# Patient Record
Sex: Male | Born: 1945 | Race: White | Hispanic: No | Smoking: Former smoker
Health system: Southern US, Community
[De-identification: ages and names within clinical notes are randomized; demographics above are authoritative.]

## PROBLEM LIST (undated history)

## (undated) DIAGNOSIS — I10 Essential (primary) hypertension: Secondary | ICD-10-CM

## (undated) HISTORY — PX: TONSILLECTOMY: SUR1361

---

## 2015-07-29 DIAGNOSIS — M5442 Lumbago with sciatica, left side: Secondary | ICD-10-CM | POA: Diagnosis not present

## 2015-07-29 DIAGNOSIS — M9902 Segmental and somatic dysfunction of thoracic region: Secondary | ICD-10-CM | POA: Diagnosis not present

## 2015-07-29 DIAGNOSIS — M9903 Segmental and somatic dysfunction of lumbar region: Secondary | ICD-10-CM | POA: Diagnosis not present

## 2015-07-29 DIAGNOSIS — M9905 Segmental and somatic dysfunction of pelvic region: Secondary | ICD-10-CM | POA: Diagnosis not present

## 2015-08-08 DIAGNOSIS — M9902 Segmental and somatic dysfunction of thoracic region: Secondary | ICD-10-CM | POA: Diagnosis not present

## 2015-08-08 DIAGNOSIS — M9903 Segmental and somatic dysfunction of lumbar region: Secondary | ICD-10-CM | POA: Diagnosis not present

## 2015-08-08 DIAGNOSIS — M9905 Segmental and somatic dysfunction of pelvic region: Secondary | ICD-10-CM | POA: Diagnosis not present

## 2015-08-08 DIAGNOSIS — M5442 Lumbago with sciatica, left side: Secondary | ICD-10-CM | POA: Diagnosis not present

## 2015-08-17 DIAGNOSIS — M5442 Lumbago with sciatica, left side: Secondary | ICD-10-CM | POA: Diagnosis not present

## 2015-08-17 DIAGNOSIS — M9902 Segmental and somatic dysfunction of thoracic region: Secondary | ICD-10-CM | POA: Diagnosis not present

## 2015-08-17 DIAGNOSIS — M9905 Segmental and somatic dysfunction of pelvic region: Secondary | ICD-10-CM | POA: Diagnosis not present

## 2015-08-17 DIAGNOSIS — M9903 Segmental and somatic dysfunction of lumbar region: Secondary | ICD-10-CM | POA: Diagnosis not present

## 2015-08-23 DIAGNOSIS — Z131 Encounter for screening for diabetes mellitus: Secondary | ICD-10-CM | POA: Diagnosis not present

## 2015-08-23 DIAGNOSIS — E785 Hyperlipidemia, unspecified: Secondary | ICD-10-CM | POA: Diagnosis not present

## 2015-08-23 DIAGNOSIS — Z0000 Encounter for general adult medical examination without abnormal findings: Secondary | ICD-10-CM | POA: Diagnosis not present

## 2015-08-23 DIAGNOSIS — Z720 Tobacco use: Secondary | ICD-10-CM | POA: Diagnosis not present

## 2015-08-23 DIAGNOSIS — Z0100 Encounter for examination of eyes and vision without abnormal findings: Secondary | ICD-10-CM | POA: Diagnosis not present

## 2015-08-23 DIAGNOSIS — E669 Obesity, unspecified: Secondary | ICD-10-CM | POA: Diagnosis not present

## 2015-08-23 DIAGNOSIS — Z136 Encounter for screening for cardiovascular disorders: Secondary | ICD-10-CM | POA: Diagnosis not present

## 2015-08-23 DIAGNOSIS — Z87891 Personal history of nicotine dependence: Secondary | ICD-10-CM | POA: Diagnosis not present

## 2015-08-23 DIAGNOSIS — I10 Essential (primary) hypertension: Secondary | ICD-10-CM | POA: Diagnosis not present

## 2015-08-23 DIAGNOSIS — Z01110 Encounter for hearing examination following failed hearing screening: Secondary | ICD-10-CM | POA: Diagnosis not present

## 2015-09-01 DIAGNOSIS — M5442 Lumbago with sciatica, left side: Secondary | ICD-10-CM | POA: Diagnosis not present

## 2015-09-01 DIAGNOSIS — M9905 Segmental and somatic dysfunction of pelvic region: Secondary | ICD-10-CM | POA: Diagnosis not present

## 2015-09-01 DIAGNOSIS — M9903 Segmental and somatic dysfunction of lumbar region: Secondary | ICD-10-CM | POA: Diagnosis not present

## 2015-09-01 DIAGNOSIS — M9902 Segmental and somatic dysfunction of thoracic region: Secondary | ICD-10-CM | POA: Diagnosis not present

## 2015-09-28 DIAGNOSIS — I10 Essential (primary) hypertension: Secondary | ICD-10-CM | POA: Diagnosis not present

## 2015-09-28 DIAGNOSIS — Z87891 Personal history of nicotine dependence: Secondary | ICD-10-CM | POA: Diagnosis not present

## 2015-09-28 DIAGNOSIS — E785 Hyperlipidemia, unspecified: Secondary | ICD-10-CM | POA: Diagnosis not present

## 2015-09-28 DIAGNOSIS — R972 Elevated prostate specific antigen [PSA]: Secondary | ICD-10-CM | POA: Diagnosis not present

## 2015-09-28 DIAGNOSIS — E669 Obesity, unspecified: Secondary | ICD-10-CM | POA: Diagnosis not present

## 2015-10-05 ENCOUNTER — Ambulatory Visit: Payer: Medicare Other | Attending: Internal Medicine | Admitting: Audiology

## 2015-10-05 DIAGNOSIS — H93292 Other abnormal auditory perceptions, left ear: Secondary | ICD-10-CM | POA: Insufficient documentation

## 2015-10-05 DIAGNOSIS — H9042 Sensorineural hearing loss, unilateral, left ear, with unrestricted hearing on the contralateral side: Secondary | ICD-10-CM

## 2015-10-05 DIAGNOSIS — H9191 Unspecified hearing loss, right ear: Secondary | ICD-10-CM | POA: Diagnosis not present

## 2015-10-05 DIAGNOSIS — Z01118 Encounter for examination of ears and hearing with other abnormal findings: Secondary | ICD-10-CM | POA: Diagnosis not present

## 2015-10-05 DIAGNOSIS — IMO0001 Reserved for inherently not codable concepts without codable children: Secondary | ICD-10-CM

## 2015-10-05 DIAGNOSIS — R94128 Abnormal results of other function studies of ear and other special senses: Secondary | ICD-10-CM | POA: Insufficient documentation

## 2015-10-05 DIAGNOSIS — H905 Unspecified sensorineural hearing loss: Secondary | ICD-10-CM | POA: Diagnosis not present
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
## 2015-12-12 DIAGNOSIS — I10 Essential (primary) hypertension: Secondary | ICD-10-CM | POA: Diagnosis not present

## 2015-12-12 DIAGNOSIS — E785 Hyperlipidemia, unspecified: Secondary | ICD-10-CM | POA: Diagnosis not present

## 2015-12-15 DIAGNOSIS — I10 Essential (primary) hypertension: Secondary | ICD-10-CM | POA: Diagnosis not present

## 2015-12-15 DIAGNOSIS — E785 Hyperlipidemia, unspecified: Secondary | ICD-10-CM | POA: Diagnosis not present

## 2015-12-28 DIAGNOSIS — I10 Essential (primary) hypertension: Secondary | ICD-10-CM | POA: Diagnosis not present

## 2015-12-28 DIAGNOSIS — E785 Hyperlipidemia, unspecified: Secondary | ICD-10-CM | POA: Diagnosis not present

## 2015-12-28 DIAGNOSIS — R972 Elevated prostate specific antigen [PSA]: Secondary | ICD-10-CM | POA: Diagnosis not present

## 2015-12-28 DIAGNOSIS — Z87891 Personal history of nicotine dependence: Secondary | ICD-10-CM | POA: Diagnosis not present

## 2016-01-06 DIAGNOSIS — M9902 Segmental and somatic dysfunction of thoracic region: Secondary | ICD-10-CM | POA: Diagnosis not present

## 2016-01-06 DIAGNOSIS — M5442 Lumbago with sciatica, left side: Secondary | ICD-10-CM | POA: Diagnosis not present

## 2016-01-06 DIAGNOSIS — M9905 Segmental and somatic dysfunction of pelvic region: Secondary | ICD-10-CM | POA: Diagnosis not present

## 2016-01-06 DIAGNOSIS — M9903 Segmental and somatic dysfunction of lumbar region: Secondary | ICD-10-CM | POA: Diagnosis not present

## 2016-01-26 DIAGNOSIS — E785 Hyperlipidemia, unspecified: Secondary | ICD-10-CM | POA: Diagnosis not present

## 2016-01-26 DIAGNOSIS — I10 Essential (primary) hypertension: Secondary | ICD-10-CM | POA: Diagnosis not present

## 2016-02-10 DIAGNOSIS — M9903 Segmental and somatic dysfunction of lumbar region: Secondary | ICD-10-CM | POA: Diagnosis not present

## 2016-02-10 DIAGNOSIS — M9905 Segmental and somatic dysfunction of pelvic region: Secondary | ICD-10-CM | POA: Diagnosis not present

## 2016-02-10 DIAGNOSIS — M5442 Lumbago with sciatica, left side: Secondary | ICD-10-CM | POA: Diagnosis not present

## 2016-02-10 DIAGNOSIS — M9902 Segmental and somatic dysfunction of thoracic region: Secondary | ICD-10-CM | POA: Diagnosis not present

## 2016-02-13 DIAGNOSIS — M9905 Segmental and somatic dysfunction of pelvic region: Secondary | ICD-10-CM | POA: Diagnosis not present

## 2016-02-13 DIAGNOSIS — M5442 Lumbago with sciatica, left side: Secondary | ICD-10-CM | POA: Diagnosis not present

## 2016-02-13 DIAGNOSIS — M9902 Segmental and somatic dysfunction of thoracic region: Secondary | ICD-10-CM | POA: Diagnosis not present

## 2016-02-13 DIAGNOSIS — M9903 Segmental and somatic dysfunction of lumbar region: Secondary | ICD-10-CM | POA: Diagnosis not present

## 2016-02-15 DIAGNOSIS — E785 Hyperlipidemia, unspecified: Secondary | ICD-10-CM | POA: Diagnosis not present

## 2016-02-15 DIAGNOSIS — M545 Low back pain: Secondary | ICD-10-CM | POA: Diagnosis not present

## 2016-02-15 DIAGNOSIS — R972 Elevated prostate specific antigen [PSA]: Secondary | ICD-10-CM | POA: Diagnosis not present

## 2016-02-15 DIAGNOSIS — I10 Essential (primary) hypertension: Secondary | ICD-10-CM | POA: Diagnosis not present

## 2016-02-16 DIAGNOSIS — M9903 Segmental and somatic dysfunction of lumbar region: Secondary | ICD-10-CM | POA: Diagnosis not present

## 2016-02-16 DIAGNOSIS — M5442 Lumbago with sciatica, left side: Secondary | ICD-10-CM | POA: Diagnosis not present

## 2016-02-16 DIAGNOSIS — M9902 Segmental and somatic dysfunction of thoracic region: Secondary | ICD-10-CM | POA: Diagnosis not present

## 2016-02-16 DIAGNOSIS — M9905 Segmental and somatic dysfunction of pelvic region: Secondary | ICD-10-CM | POA: Diagnosis not present

## 2016-02-16 DIAGNOSIS — I10 Essential (primary) hypertension: Secondary | ICD-10-CM | POA: Diagnosis not present

## 2016-02-16 DIAGNOSIS — E785 Hyperlipidemia, unspecified: Secondary | ICD-10-CM | POA: Diagnosis not present

## 2016-02-17 DIAGNOSIS — M5127 Other intervertebral disc displacement, lumbosacral region: Secondary | ICD-10-CM | POA: Diagnosis not present

## 2016-02-17 DIAGNOSIS — M5417 Radiculopathy, lumbosacral region: Secondary | ICD-10-CM | POA: Diagnosis not present

## 2016-02-22 DIAGNOSIS — M5442 Lumbago with sciatica, left side: Secondary | ICD-10-CM | POA: Diagnosis not present

## 2016-02-22 DIAGNOSIS — M9902 Segmental and somatic dysfunction of thoracic region: Secondary | ICD-10-CM | POA: Diagnosis not present

## 2016-02-22 DIAGNOSIS — M9905 Segmental and somatic dysfunction of pelvic region: Secondary | ICD-10-CM | POA: Diagnosis not present

## 2016-02-22 DIAGNOSIS — M9903 Segmental and somatic dysfunction of lumbar region: Secondary | ICD-10-CM | POA: Diagnosis not present

## 2016-02-23 ENCOUNTER — Other Ambulatory Visit: Payer: Self-pay | Admitting: Rehabilitation

## 2016-02-23 DIAGNOSIS — M5416 Radiculopathy, lumbar region: Secondary | ICD-10-CM

## 2016-02-27 ENCOUNTER — Ambulatory Visit
Admission: RE | Admit: 2016-02-27 | Discharge: 2016-02-27 | Disposition: A | Discharge status: Home / Self Care | Payer: Medicare Other | Source: Ambulatory Visit | Attending: Rehabilitation | Admitting: Rehabilitation

## 2016-02-27 DIAGNOSIS — M4806 Spinal stenosis, lumbar region: Secondary | ICD-10-CM | POA: Diagnosis not present

## 2016-02-27 DIAGNOSIS — M5416 Radiculopathy, lumbar region: Secondary | ICD-10-CM

## 2016-02-28 DIAGNOSIS — I10 Essential (primary) hypertension: Secondary | ICD-10-CM | POA: Diagnosis not present

## 2016-02-28 DIAGNOSIS — M5127 Other intervertebral disc displacement, lumbosacral region: Secondary | ICD-10-CM | POA: Diagnosis not present

## 2016-02-28 DIAGNOSIS — M5417 Radiculopathy, lumbosacral region: Secondary | ICD-10-CM | POA: Diagnosis not present

## 2016-02-29 DIAGNOSIS — Z87891 Personal history of nicotine dependence: Secondary | ICD-10-CM | POA: Diagnosis not present

## 2016-02-29 DIAGNOSIS — I10 Essential (primary) hypertension: Secondary | ICD-10-CM | POA: Diagnosis not present

## 2016-02-29 DIAGNOSIS — M9902 Segmental and somatic dysfunction of thoracic region: Secondary | ICD-10-CM | POA: Diagnosis not present

## 2016-02-29 DIAGNOSIS — R7303 Prediabetes: Secondary | ICD-10-CM | POA: Diagnosis not present

## 2016-02-29 DIAGNOSIS — M9903 Segmental and somatic dysfunction of lumbar region: Secondary | ICD-10-CM | POA: Diagnosis not present

## 2016-02-29 DIAGNOSIS — E785 Hyperlipidemia, unspecified: Secondary | ICD-10-CM | POA: Diagnosis not present

## 2016-02-29 DIAGNOSIS — M5442 Lumbago with sciatica, left side: Secondary | ICD-10-CM | POA: Diagnosis not present

## 2016-02-29 DIAGNOSIS — M9905 Segmental and somatic dysfunction of pelvic region: Secondary | ICD-10-CM | POA: Diagnosis not present

## 2016-03-16 DIAGNOSIS — E785 Hyperlipidemia, unspecified: Secondary | ICD-10-CM | POA: Diagnosis not present

## 2016-03-16 DIAGNOSIS — I10 Essential (primary) hypertension: Secondary | ICD-10-CM | POA: Diagnosis not present

## 2016-03-28 DIAGNOSIS — E785 Hyperlipidemia, unspecified: Secondary | ICD-10-CM | POA: Diagnosis not present

## 2016-03-28 DIAGNOSIS — I10 Essential (primary) hypertension: Secondary | ICD-10-CM | POA: Diagnosis not present

## 2016-03-28 DIAGNOSIS — R7303 Prediabetes: Secondary | ICD-10-CM | POA: Diagnosis not present

## 2016-03-28 DIAGNOSIS — Z87891 Personal history of nicotine dependence: Secondary | ICD-10-CM | POA: Diagnosis not present

## 2016-04-08 ENCOUNTER — Emergency Department (HOSPITAL_BASED_OUTPATIENT_CLINIC_OR_DEPARTMENT_OTHER): Payer: Medicare Other

## 2016-04-08 ENCOUNTER — Emergency Department (HOSPITAL_BASED_OUTPATIENT_CLINIC_OR_DEPARTMENT_OTHER)
Admission: EM | Admit: 2016-04-08 | Discharge: 2016-04-08 | Disposition: A | Discharge status: Home / Self Care | Payer: Medicare Other | Attending: Emergency Medicine | Admitting: Emergency Medicine

## 2016-04-08 ENCOUNTER — Encounter (HOSPITAL_BASED_OUTPATIENT_CLINIC_OR_DEPARTMENT_OTHER): Payer: Self-pay | Admitting: *Deleted

## 2016-04-08 DIAGNOSIS — Z87891 Personal history of nicotine dependence: Secondary | ICD-10-CM | POA: Insufficient documentation

## 2016-04-08 DIAGNOSIS — Y9355 Activity, bike riding: Secondary | ICD-10-CM | POA: Diagnosis not present

## 2016-04-08 DIAGNOSIS — Y999 Unspecified external cause status: Secondary | ICD-10-CM | POA: Insufficient documentation

## 2016-04-08 DIAGNOSIS — R109 Unspecified abdominal pain: Secondary | ICD-10-CM | POA: Insufficient documentation

## 2016-04-08 DIAGNOSIS — S299XXA Unspecified injury of thorax, initial encounter: Secondary | ICD-10-CM | POA: Diagnosis not present

## 2016-04-08 DIAGNOSIS — R0789 Other chest pain: Secondary | ICD-10-CM | POA: Diagnosis not present

## 2016-04-08 DIAGNOSIS — S2231XA Fracture of one rib, right side, initial encounter for closed fracture: Secondary | ICD-10-CM

## 2016-04-08 DIAGNOSIS — Z79899 Other long term (current) drug therapy: Secondary | ICD-10-CM | POA: Diagnosis not present

## 2016-04-08 DIAGNOSIS — S2241XA Multiple fractures of ribs, right side, initial encounter for closed fracture: Secondary | ICD-10-CM | POA: Diagnosis not present

## 2016-04-08 DIAGNOSIS — S3991XA Unspecified injury of abdomen, initial encounter: Secondary | ICD-10-CM | POA: Diagnosis not present

## 2016-04-08 DIAGNOSIS — Y92828 Other wilderness area as the place of occurrence of the external cause: Secondary | ICD-10-CM | POA: Diagnosis not present

## 2016-04-08 DIAGNOSIS — I10 Essential (primary) hypertension: Secondary | ICD-10-CM | POA: Insufficient documentation

## 2016-04-08 DIAGNOSIS — R079 Chest pain, unspecified: Secondary | ICD-10-CM | POA: Diagnosis present

## 2016-04-08 HISTORY — DX: Essential (primary) hypertension: I10

## 2016-04-08 LAB — BASIC METABOLIC PANEL
Anion gap: 9 (ref 5–15)
BUN: 28 mg/dL — ABNORMAL HIGH (ref 6–20)
CO2: 27 mmol/L (ref 22–32)
Calcium: 9.5 mg/dL (ref 8.9–10.3)
Chloride: 105 mmol/L (ref 101–111)
Creatinine, Ser: 1.14 mg/dL (ref 0.61–1.24)
GFR calc Af Amer: >60 mL/min (ref >60)
GFR calc non Af Amer: >60 mL/min (ref >60)
Glucose, Bld: 146 mg/dL — ABNORMAL HIGH (ref 65–99)
Potassium: 4.6 mmol/L (ref 3.5–5.1)
Sodium: 141 mmol/L (ref 135–145)

## 2016-04-08 LAB — CBC WITH DIFFERENTIAL/PLATELET
Basophils Absolute: 0 10*3/uL (ref 0.0–0.1)
Basophils Relative: 0 %
Eosinophils Absolute: 0 10*3/uL (ref 0.0–0.7)
Eosinophils Relative: 0 %
HCT: 42.9 % (ref 39.0–52.0)
Hemoglobin: 14.8 g/dL (ref 13.0–17.0)
Lymphocytes Relative: 7 %
Lymphs Abs: 1.1 10*3/uL (ref 0.7–4.0)
MCH: 30 pg (ref 26.0–34.0)
MCHC: 34.5 g/dL (ref 30.0–36.0)
MCV: 87 fL (ref 78.0–100.0)
Monocytes Absolute: 1 10*3/uL (ref 0.1–1.0)
Monocytes Relative: 7 %
Neutro Abs: 12.7 10*3/uL — ABNORMAL HIGH (ref 1.7–7.7)
Neutrophils Relative %: 86 %
Platelets: 173 10*3/uL (ref 150–400)
RBC: 4.93 MIL/uL (ref 4.22–5.81)
RDW: 13.9 % (ref 11.5–15.5)
WBC: 14.8 10*3/uL — ABNORMAL HIGH (ref 4.0–10.5)

## 2016-04-08 MED ORDER — TRAMADOL HCL 50 MG PO TABS: 50.0000 mg | ORAL_TABLET | Freq: Four times a day (QID) | ORAL | Status: DC | PRN

## 2016-04-08 MED ORDER — KETOROLAC TROMETHAMINE 30 MG/ML IJ SOLN
15.0000 mg | Freq: Once | INTRAMUSCULAR | Status: AC
  Administered 2016-04-08: 15 mg via INTRAVENOUS
  Filled 2016-04-08: qty 1

## 2016-04-08 MED ORDER — IOPAMIDOL (ISOVUE-300) INJECTION 61%
100.0000 mL | Freq: Once | INTRAVENOUS | Status: AC | PRN
Start: 2016-04-08 — End: 2016-04-08
  Administered 2016-04-08: 100 mL via INTRAVENOUS

## 2016-04-08 MED ORDER — PERCOCET 5-325 MG PO TABS: 1.0000 | ORAL_TABLET | Freq: Four times a day (QID) | ORAL | Status: DC | PRN

## 2016-04-08 MED ORDER — IBUPROFEN 800 MG PO TABS: 800.0000 mg | ORAL_TABLET | Freq: Three times a day (TID) | ORAL | Status: AC | PRN

## 2016-04-08 NOTE — Discharge Instructions (Signed)
Return here for any worsening in your condition.  Follow-up with her primary care doctor.  Use ice and heat over the areas that are sore
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
## 2016-04-08 NOTE — ED Notes (Signed)
Pt ambulated around dept. Sats stable at 97% hr 107. tol well
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
## 2016-04-11 DIAGNOSIS — S2241XA Multiple fractures of ribs, right side, initial encounter for closed fracture: Secondary | ICD-10-CM | POA: Diagnosis not present

## 2016-04-11 DIAGNOSIS — M5127 Other intervertebral disc displacement, lumbosacral region: Secondary | ICD-10-CM | POA: Diagnosis not present

## 2016-04-18 DIAGNOSIS — E785 Hyperlipidemia, unspecified: Secondary | ICD-10-CM | POA: Diagnosis not present

## 2016-04-18 DIAGNOSIS — I10 Essential (primary) hypertension: Secondary | ICD-10-CM | POA: Diagnosis not present

## 2016-05-17 DIAGNOSIS — E785 Hyperlipidemia, unspecified: Secondary | ICD-10-CM | POA: Diagnosis not present

## 2016-05-17 DIAGNOSIS — I10 Essential (primary) hypertension: Secondary | ICD-10-CM | POA: Diagnosis not present

## 2016-06-11 ENCOUNTER — Other Ambulatory Visit: Payer: Self-pay | Admitting: Gastroenterology

## 2016-07-13 DIAGNOSIS — E785 Hyperlipidemia, unspecified: Secondary | ICD-10-CM | POA: Diagnosis not present

## 2016-07-13 DIAGNOSIS — I10 Essential (primary) hypertension: Secondary | ICD-10-CM | POA: Diagnosis not present

## 2016-08-10 DIAGNOSIS — E785 Hyperlipidemia, unspecified: Secondary | ICD-10-CM | POA: Diagnosis not present

## 2016-08-10 DIAGNOSIS — I10 Essential (primary) hypertension: Secondary | ICD-10-CM | POA: Diagnosis not present

## 2016-08-22 ENCOUNTER — Encounter (HOSPITAL_COMMUNITY): Payer: Self-pay | Admitting: *Deleted

## 2016-08-23 DIAGNOSIS — Z131 Encounter for screening for diabetes mellitus: Secondary | ICD-10-CM | POA: Diagnosis not present

## 2016-08-23 DIAGNOSIS — Z0000 Encounter for general adult medical examination without abnormal findings: Secondary | ICD-10-CM | POA: Diagnosis not present

## 2016-08-23 DIAGNOSIS — Z87891 Personal history of nicotine dependence: Secondary | ICD-10-CM | POA: Diagnosis not present

## 2016-08-23 DIAGNOSIS — I10 Essential (primary) hypertension: Secondary | ICD-10-CM | POA: Diagnosis not present

## 2016-08-23 DIAGNOSIS — H538 Other visual disturbances: Secondary | ICD-10-CM | POA: Diagnosis not present

## 2016-08-23 DIAGNOSIS — R7303 Prediabetes: Secondary | ICD-10-CM | POA: Diagnosis not present

## 2016-08-23 DIAGNOSIS — Z01118 Encounter for examination of ears and hearing with other abnormal findings: Secondary | ICD-10-CM | POA: Diagnosis not present

## 2016-08-28 ENCOUNTER — Ambulatory Visit (HOSPITAL_COMMUNITY): Payer: Medicare Other | Admitting: Anesthesiology

## 2016-08-28 ENCOUNTER — Encounter (HOSPITAL_COMMUNITY)
Admission: RE | Disposition: A | Discharge status: Home / Self Care | Payer: Self-pay | Source: Ambulatory Visit | Attending: Gastroenterology

## 2016-08-28 ENCOUNTER — Ambulatory Visit (HOSPITAL_COMMUNITY)
Admission: RE | Admit: 2016-08-28 | Discharge: 2016-08-28 | Disposition: A | Discharge status: Home / Self Care | Payer: Medicare Other | Source: Ambulatory Visit | Attending: Gastroenterology | Admitting: Gastroenterology

## 2016-08-28 ENCOUNTER — Encounter (HOSPITAL_COMMUNITY): Payer: Self-pay

## 2016-08-28 DIAGNOSIS — E7800 Pure hypercholesterolemia, unspecified: Secondary | ICD-10-CM | POA: Insufficient documentation

## 2016-08-28 DIAGNOSIS — R933 Abnormal findings on diagnostic imaging of other parts of digestive tract: Secondary | ICD-10-CM | POA: Diagnosis not present

## 2016-08-28 DIAGNOSIS — Z1211 Encounter for screening for malignant neoplasm of colon: Secondary | ICD-10-CM | POA: Insufficient documentation

## 2016-08-28 DIAGNOSIS — Z8719 Personal history of other diseases of the digestive system: Secondary | ICD-10-CM | POA: Insufficient documentation

## 2016-08-28 DIAGNOSIS — K5190 Ulcerative colitis, unspecified, without complications: Secondary | ICD-10-CM | POA: Diagnosis not present

## 2016-08-28 DIAGNOSIS — I10 Essential (primary) hypertension: Secondary | ICD-10-CM | POA: Insufficient documentation

## 2016-08-28 DIAGNOSIS — Z79899 Other long term (current) drug therapy: Secondary | ICD-10-CM | POA: Insufficient documentation

## 2016-08-28 DIAGNOSIS — K5100 Ulcerative (chronic) pancolitis without complications: Secondary | ICD-10-CM | POA: Insufficient documentation

## 2016-08-28 DIAGNOSIS — R938 Abnormal findings on diagnostic imaging of other specified body structures: Secondary | ICD-10-CM | POA: Diagnosis not present

## 2016-08-28 DIAGNOSIS — R12 Heartburn: Secondary | ICD-10-CM | POA: Diagnosis not present

## 2016-08-28 DIAGNOSIS — K633 Ulcer of intestine: Secondary | ICD-10-CM | POA: Diagnosis not present

## 2016-08-28 DIAGNOSIS — Z791 Long term (current) use of non-steroidal anti-inflammatories (NSAID): Secondary | ICD-10-CM | POA: Insufficient documentation

## 2016-08-28 DIAGNOSIS — K5130 Ulcerative (chronic) rectosigmoiditis without complications: Secondary | ICD-10-CM | POA: Diagnosis not present

## 2016-08-28 DIAGNOSIS — Z87891 Personal history of nicotine dependence: Secondary | ICD-10-CM | POA: Insufficient documentation

## 2016-08-28 HISTORY — PX: ESOPHAGOGASTRODUODENOSCOPY: SHX5428

## 2016-08-28 HISTORY — PX: COLONOSCOPY WITH PROPOFOL: SHX5780

## 2016-08-28 SURGERY — COLONOSCOPY WITH PROPOFOL
Anesthesia: Monitor Anesthesia Care

## 2016-08-28 MED ORDER — LIDOCAINE 2% (20 MG/ML) 5 ML SYRINGE
INTRAMUSCULAR | Status: DC | PRN
  Administered 2016-08-28: 60 mg via INTRAVENOUS

## 2016-08-28 MED ORDER — LACTATED RINGERS IV SOLN
INTRAVENOUS | Status: DC
  Administered 2016-08-28: 09:00:00 via INTRAVENOUS

## 2016-08-28 MED ORDER — SODIUM CHLORIDE 0.9 % IV SOLN: INTRAVENOUS | Status: DC

## 2016-08-28 MED ORDER — PROPOFOL 10 MG/ML IV BOLUS
INTRAVENOUS | Status: AC
  Filled 2016-08-28: qty 60

## 2016-08-28 MED ORDER — PROPOFOL 10 MG/ML IV BOLUS
INTRAVENOUS | Status: DC | PRN
  Administered 2016-08-28: 20 mg via INTRAVENOUS
  Administered 2016-08-28: 30 mg via INTRAVENOUS

## 2016-08-28 MED ORDER — PROPOFOL 500 MG/50ML IV EMUL
INTRAVENOUS | Status: DC | PRN
  Administered 2016-08-28: 120 ug/kg/min via INTRAVENOUS

## 2016-08-28 MED ORDER — LIDOCAINE 2% (20 MG/ML) 5 ML SYRINGE
INTRAMUSCULAR | Status: AC
  Filled 2016-08-28: qty 5

## 2016-08-28 SURGICAL SUPPLY — 21 items

## 2016-08-28 NOTE — Transfer of Care (Signed)
Immediate Anesthesia Transfer of Care Note  Patient: Joseph PateJoseph Sheppard  Procedure(s) Performed: Procedure(s): COLONOSCOPY WITH PROPOFOL (N/A) ESOPHAGOGASTRODUODENOSCOPY (EGD) (N/A)  Patient Location: PACU  Anesthesia Type:MAC  Level of Consciousness:  sedated, patient cooperative and responds to stimulation  Airway & Oxygen Therapy:Patient Spontanous Breathing and Patient connected to face mask oxgen  Post-op Assessment:  Report given to PACU RN and Post -op Vital signs reviewed and stable  Post vital signs:  Reviewed and stable  Last Vitals:  Vitals:   08/28/16 0912  BP: (!) 134/93  Pulse: 74  Resp: 15  Temp: 36.6 C    Complications: No apparent anesthesia complications

## 2016-08-28 NOTE — Anesthesia Procedure Notes (Signed)
Procedure Name: MAC Date/Time: 08/28/2016 11:10 AM Performed by: Delphia GratesHANDLER, Elide Stalzer Pre-anesthesia Checklist: Patient identified, Emergency Drugs available, Suction available and Patient being monitored Patient Re-evaluated:Patient Re-evaluated prior to inductionOxygen Delivery Method: Nasal cannula Placement Confirmation: positive ETCO2

## 2016-08-28 NOTE — Op Note (Signed)
[facility] Patient Name: Joseph PateJoseph Sheppard Procedure Date: 08/28/2016 MRN: [id number] Attending MD: Charolett BumpersMartin K Johnson , MD Date of Birth: 05/01/1946 CSN: [id number] Age: [age] Admit Type: Outpatient Procedure:                Colonoscopy Indications:              Chronic ulcerative pancolitis Providers:                Charolett BumpersMartin K. Johnson, MD, Omelia BlackwaterShelby Carpenter RN, RN,                            Clearnce SorrelKatie Smith, Technician, Delphia GratesLoraine Chandler, CRNA Referring MD:              Medicines:                Propofol per Anesthesia Complications:            No immediate complications. Estimated Blood Loss:     Estimated blood loss: none. Procedure:                Pre-Anesthesia Assessment:                           - Prior to the procedure, a History and Physical                            was performed, and patient medications and                            allergies were reviewed. The patient's tolerance of                            previous anesthesia was also reviewed. The risks                            and benefits of the procedure and the sedation                            options and risks were discussed with the patient.                            All questions were answered, and informed consent                            was obtained. Prior Anticoagulants: The patient has                            taken no previous anticoagulant or antiplatelet                            agents. ASA Grade Assessment: II - A patient with                            mild systemic disease. After reviewing the risks  and benefits, the patient was deemed in                            satisfactory condition to undergo the procedure.                           After obtaining informed consent, the colonoscope                            was passed under direct vision. Throughout the                            procedure, the patient's blood pressure, pulse, and             oxygen saturations were monitored continuously. The                            EC-[id number] ([id number] scope was introduced through                            the anus and advanced to the the cecum, identified                            by appendiceal orifice and ileocecal valve. The                            colonoscopy was performed without difficulty. The                            patient tolerated the procedure well. The quality                            of the bowel preparation was good. The appendiceal                            orifice and the rectum were photographed. Scope In: 11:19:13 AM Scope Out: 11:44:24 AM Scope Withdrawal Time: 0 hours 17 minutes 1 second  Total Procedure Duration: 0 hours 25 minutes 11 seconds  Findings:      The perianal and digital rectal examinations were normal.      A continuous area of nonbleeding ulcerated mucosa with no stigmata of       recent bleeding was present in the cecum.      A continuous area of nonbleeding ulcerated mucosa with no stigmata of       recent bleeding was present in the ascending colon.      The exam was otherwise without abnormality.      Four biopsies were taken every 10 cm with a cold forceps from the cecum,       ascending colon, right transverse colon, left transverse colon,       descending colon, sigmoid colon and rectum for dysplasia surveillance. Impression:               - Mucosal ulceration.                           -  Mucosal ulceration.                           - The examination was otherwise normal.                           - Biopsies for surveillance were taken from the                            cecum, ascending colon, right transverse colon,                            left transverse colon, descending colon, sigmoid                            colon and rectum. Moderate Sedation:      N/A- Per Anesthesia Care Recommendation:           - Patient has a contact number available for                             emergencies. The signs and symptoms of potential                            delayed complications were discussed with the                            patient. Return to normal activities tomorrow.                            Written discharge instructions were provided to the                            patient.                           - Repeat colonoscopy date to be determined after                            pending pathology results are reviewed for                            surveillance.                           - Resume previous diet.                           - Continue present medications. Procedure Code(s):        --- Professional ---                           [phone], Colonoscopy, flexible; with biopsy, single                            or multiple Diagnosis Code(s):        --- Professional ---  K63.3, Ulcer of intestine                           K51.00, Ulcerative (chronic) pancolitis without                            complications CPT copyright 2016 American Medical Association. All rights reserved. The codes documented in this report are preliminary and upon coder review may  be revised to meet current compliance requirements. Danise Edge, MD Charolett Bumpers, MD 08/28/2016 11:53:25 AM This report has been signed electronically. Number of Addenda: 0

## 2016-08-28 NOTE — Op Note (Signed)
[facility] Patient Name: Joseph PateJoseph Stogner Procedure Date: 08/28/2016 MRN: [id number] Attending MD: Charolett BumpersMartin K Johnson , MD Date of Birth: June 12, 1946 CSN: [id number] Age: [id number] Admit Type: Outpatient Procedure:                Upper GI endoscopy Indications:              Infrequent heartburn. 04/08/2016 CT scan                            chest?"abdomen?"pelvis showed thickening of the                            distal esophagus Providers:                Charolett BumpersMartin K. Johnson, MD, Omelia BlackwaterShelby Carpenter RN, RN,                            Clearnce SorrelKatie Smith, Technician, Delphia GratesLoraine Chandler, CRNA Referring MD:              Medicines:                Propofol per Anesthesia Complications:            No immediate complications. Estimated Blood Loss:     Estimated blood loss: none. Procedure:                Pre-Anesthesia Assessment:                           - Prior to the procedure, a History and Physical                            was performed, and patient medications and                            allergies were reviewed. The patient's tolerance of                            previous anesthesia was also reviewed. The risks                            and benefits of the procedure and the sedation                            options and risks were discussed with the patient.                            All questions were answered, and informed consent                            was obtained. Prior Anticoagulants: The patient has                            taken no previous anticoagulant or antiplatelet                            agents.  ASA Grade Assessment: II - A patient with                            mild systemic disease. After reviewing the risks                            and benefits, the patient was deemed in                            satisfactory condition to undergo the procedure.                           After obtaining informed consent, the endoscope was   passed under direct vision. Throughout the                            procedure, the patient's blood pressure, pulse, and                            oxygen saturations were monitored continuously. The                            Endoscope was introduced through the mouth, and                            advanced to the second part of duodenum. The upper                            GI endoscopy was accomplished without difficulty.                            The patient tolerated the procedure well. Scope In: Scope Out: Findings:      The Z-line was regular and was found 40 cm from the incisors.      The examined esophagus was normal.      The entire examined stomach was normal.      The examined duodenum was normal. Impression:               - Z-line regular, 40 cm from the incisors.                           - Normal esophagus.                           - Normal stomach.                           - Normal examined duodenum.                           - No specimens collected. Moderate Sedation:      N/A- Per Anesthesia Care Recommendation:           - Patient has a contact number available for  emergencies. The signs and symptoms of potential                            delayed complications were discussed with the                            patient. Return to normal activities tomorrow.                            Written discharge instructions were provided to the                            patient.                           - Return to primary care physician PRN.                           - Resume previous diet.                           - Continue present medications. Procedure Code(s):        --- Professional ---                           [phone], Esophagogastroduodenoscopy, flexible,                            transoral; diagnostic, including collection of                            specimen(s) by brushing or washing, when performed                             (separate procedure) Diagnosis Code(s):        --- Professional ---                           R12, Heartburn CPT copyright 2016 American Medical Association. All rights reserved. The codes documented in this report are preliminary and upon coder review may  be revised to meet current compliance requirements. Danise EdgeMartin Johnson, MD Charolett BumpersMartin K Johnson, MD 08/28/2016 11:58:53 AM This report has been signed electronically. Number of Addenda: 0

## 2016-08-28 NOTE — Discharge Instructions (Signed)

## 2016-08-28 NOTE — Anesthesia Postprocedure Evaluation (Signed)
Anesthesia Post Note  Patient: Joseph PateJoseph Sheppard  Procedure(s) Performed: Procedure(s) (LRB): COLONOSCOPY WITH PROPOFOL (N/A) ESOPHAGOGASTRODUODENOSCOPY (EGD) (N/A)  Patient location during evaluation: PACU Anesthesia Type: MAC Level of consciousness: awake and alert Pain management: pain level controlled Vital Signs Assessment: post-procedure vital signs reviewed and stable Respiratory status: spontaneous breathing, nonlabored ventilation, respiratory function stable and patient connected to nasal cannula oxygen Cardiovascular status: stable and blood pressure returned to baseline Anesthetic complications: no    Last Vitals:  Vitals:   08/28/16 0912 08/28/16 1153  BP: (!) 134/93 (!) 108/32  Pulse: 74 69  Resp: 15 12  Temp: 36.6 C 36.4 C    Last Pain:  Vitals:   08/28/16 1153  TempSrc: Oral                 Dayrin Stallone S
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
## 2016-08-28 NOTE — Anesthesia Preprocedure Evaluation (Signed)
Anesthesia Evaluation  Patient identified by MRN, date of birth, ID band Patient awake    Reviewed: Allergy & Precautions, H&P , NPO status , Patient's Chart, lab work & pertinent test results  Airway Mallampati: II   Neck ROM: full    Dental   Pulmonary former smoker,    breath sounds clear to auscultation       Cardiovascular hypertension,  Rhythm:regular Rate:Normal     Neuro/Psych    GI/Hepatic   Endo/Other    Renal/GU      Musculoskeletal   Abdominal   Peds  Hematology   Anesthesia Other Findings   Reproductive/Obstetrics                             Anesthesia Physical Anesthesia Plan  ASA: II  Anesthesia Plan: MAC   Post-op Pain Management:    Induction: Intravenous  Airway Management Planned: Simple Face Mask  Additional Equipment:   Intra-op Plan:   Post-operative Plan:   Informed Consent: I have reviewed the patients History and Physical, chart, labs and discussed the procedure including the risks, benefits and alternatives for the proposed anesthesia with the patient or authorized representative who has indicated his/her understanding and acceptance.     Plan Discussed with: CRNA, Anesthesiologist and Surgeon  Anesthesia Plan Comments:         Anesthesia Quick Evaluation

## 2016-08-30 ENCOUNTER — Encounter (HOSPITAL_COMMUNITY): Payer: Self-pay | Admitting: Gastroenterology

## 2016-09-07 DIAGNOSIS — I10 Essential (primary) hypertension: Secondary | ICD-10-CM | POA: Diagnosis not present

## 2016-09-07 DIAGNOSIS — E785 Hyperlipidemia, unspecified: Secondary | ICD-10-CM | POA: Diagnosis not present

## 2016-09-27 DIAGNOSIS — R7303 Prediabetes: Secondary | ICD-10-CM | POA: Diagnosis not present

## 2016-09-27 DIAGNOSIS — I10 Essential (primary) hypertension: Secondary | ICD-10-CM | POA: Diagnosis not present

## 2016-09-27 DIAGNOSIS — E785 Hyperlipidemia, unspecified: Secondary | ICD-10-CM | POA: Diagnosis not present

## 2016-09-27 DIAGNOSIS — Z87891 Personal history of nicotine dependence: Secondary | ICD-10-CM | POA: Diagnosis not present

## 2016-10-11 DIAGNOSIS — I10 Essential (primary) hypertension: Secondary | ICD-10-CM | POA: Diagnosis not present

## 2016-10-11 DIAGNOSIS — E785 Hyperlipidemia, unspecified: Secondary | ICD-10-CM | POA: Diagnosis not present

## 2016-12-26 DIAGNOSIS — E785 Hyperlipidemia, unspecified: Secondary | ICD-10-CM | POA: Diagnosis not present

## 2016-12-26 DIAGNOSIS — Z87891 Personal history of nicotine dependence: Secondary | ICD-10-CM | POA: Diagnosis not present

## 2016-12-26 DIAGNOSIS — R7303 Prediabetes: Secondary | ICD-10-CM | POA: Diagnosis not present

## 2016-12-26 DIAGNOSIS — I10 Essential (primary) hypertension: Secondary | ICD-10-CM | POA: Diagnosis not present

## 2016-12-26 DIAGNOSIS — E669 Obesity, unspecified: Secondary | ICD-10-CM | POA: Diagnosis not present

## 2016-12-26 DIAGNOSIS — L723 Sebaceous cyst: Secondary | ICD-10-CM | POA: Diagnosis not present

## 2017-08-26 DIAGNOSIS — Z87891 Personal history of nicotine dependence: Secondary | ICD-10-CM | POA: Diagnosis not present

## 2017-08-26 DIAGNOSIS — L723 Sebaceous cyst: Secondary | ICD-10-CM | POA: Diagnosis not present

## 2017-08-26 DIAGNOSIS — Z0000 Encounter for general adult medical examination without abnormal findings: Secondary | ICD-10-CM | POA: Diagnosis not present

## 2017-08-26 DIAGNOSIS — E785 Hyperlipidemia, unspecified: Secondary | ICD-10-CM | POA: Diagnosis not present

## 2017-08-26 DIAGNOSIS — I10 Essential (primary) hypertension: Secondary | ICD-10-CM | POA: Diagnosis not present

## 2017-08-26 DIAGNOSIS — R7303 Prediabetes: Secondary | ICD-10-CM | POA: Diagnosis not present

## 2017-08-26 DIAGNOSIS — E669 Obesity, unspecified: Secondary | ICD-10-CM | POA: Diagnosis not present

## 2017-09-04 DIAGNOSIS — E785 Hyperlipidemia, unspecified: Secondary | ICD-10-CM | POA: Diagnosis not present

## 2017-09-04 DIAGNOSIS — I10 Essential (primary) hypertension: Secondary | ICD-10-CM | POA: Diagnosis not present
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
## 2017-10-23 DIAGNOSIS — E785 Hyperlipidemia, unspecified: Secondary | ICD-10-CM | POA: Diagnosis not present

## 2017-10-23 DIAGNOSIS — I10 Essential (primary) hypertension: Secondary | ICD-10-CM | POA: Diagnosis not present

## 2017-11-25 DIAGNOSIS — I10 Essential (primary) hypertension: Secondary | ICD-10-CM | POA: Diagnosis not present

## 2017-11-25 DIAGNOSIS — Z87891 Personal history of nicotine dependence: Secondary | ICD-10-CM | POA: Diagnosis not present

## 2017-11-25 DIAGNOSIS — R7303 Prediabetes: Secondary | ICD-10-CM | POA: Diagnosis not present

## 2017-11-25 DIAGNOSIS — E669 Obesity, unspecified: Secondary | ICD-10-CM | POA: Diagnosis not present

## 2017-11-25 DIAGNOSIS — E785 Hyperlipidemia, unspecified: Secondary | ICD-10-CM | POA: Diagnosis not present

## 2017-12-04 DIAGNOSIS — E785 Hyperlipidemia, unspecified: Secondary | ICD-10-CM | POA: Diagnosis not present

## 2017-12-04 DIAGNOSIS — I10 Essential (primary) hypertension: Secondary | ICD-10-CM | POA: Diagnosis not present

## 2018-01-01 DIAGNOSIS — I10 Essential (primary) hypertension: Secondary | ICD-10-CM | POA: Diagnosis not present

## 2018-01-01 DIAGNOSIS — E785 Hyperlipidemia, unspecified: Secondary | ICD-10-CM | POA: Diagnosis not present

## 2018-01-22 DIAGNOSIS — I10 Essential (primary) hypertension: Secondary | ICD-10-CM | POA: Diagnosis not present

## 2018-01-22 DIAGNOSIS — E785 Hyperlipidemia, unspecified: Secondary | ICD-10-CM | POA: Diagnosis not present

## 2018-02-26 DIAGNOSIS — E785 Hyperlipidemia, unspecified: Secondary | ICD-10-CM | POA: Diagnosis not present

## 2018-02-26 DIAGNOSIS — I10 Essential (primary) hypertension: Secondary | ICD-10-CM | POA: Diagnosis not present

## 2018-05-26 DIAGNOSIS — E785 Hyperlipidemia, unspecified: Secondary | ICD-10-CM | POA: Diagnosis not present

## 2018-05-26 DIAGNOSIS — I10 Essential (primary) hypertension: Secondary | ICD-10-CM | POA: Diagnosis not present

## 2018-05-26 DIAGNOSIS — Z87891 Personal history of nicotine dependence: Secondary | ICD-10-CM | POA: Diagnosis not present

## 2018-05-26 DIAGNOSIS — R7303 Prediabetes: Secondary | ICD-10-CM | POA: Diagnosis not present

## 2018-05-26 DIAGNOSIS — E669 Obesity, unspecified: Secondary | ICD-10-CM | POA: Diagnosis not present

## 2018-05-26 DIAGNOSIS — Z131 Encounter for screening for diabetes mellitus: Secondary | ICD-10-CM | POA: Diagnosis not present

## 2018-05-26 DIAGNOSIS — R350 Frequency of micturition: Secondary | ICD-10-CM | POA: Diagnosis not present

## 2018-05-26 DIAGNOSIS — Z125 Encounter for screening for malignant neoplasm of prostate: Secondary | ICD-10-CM | POA: Diagnosis not present

## 2018-06-30 DIAGNOSIS — R3912 Poor urinary stream: Secondary | ICD-10-CM | POA: Diagnosis not present

## 2018-06-30 DIAGNOSIS — R972 Elevated prostate specific antigen [PSA]: Secondary | ICD-10-CM | POA: Diagnosis not present

## 2018-06-30 DIAGNOSIS — N5201 Erectile dysfunction due to arterial insufficiency: Secondary | ICD-10-CM | POA: Diagnosis not present

## 2018-08-26 DIAGNOSIS — R7303 Prediabetes: Secondary | ICD-10-CM | POA: Diagnosis not present

## 2018-08-26 DIAGNOSIS — E785 Hyperlipidemia, unspecified: Secondary | ICD-10-CM | POA: Diagnosis not present

## 2018-08-26 DIAGNOSIS — Z87891 Personal history of nicotine dependence: Secondary | ICD-10-CM | POA: Diagnosis not present

## 2018-08-26 DIAGNOSIS — H538 Other visual disturbances: Secondary | ICD-10-CM | POA: Diagnosis not present

## 2018-08-26 DIAGNOSIS — E669 Obesity, unspecified: Secondary | ICD-10-CM | POA: Diagnosis not present

## 2018-08-26 DIAGNOSIS — Z2821 Immunization not carried out because of patient refusal: Secondary | ICD-10-CM | POA: Diagnosis not present

## 2018-08-26 DIAGNOSIS — Z01110 Encounter for hearing examination following failed hearing screening: Secondary | ICD-10-CM | POA: Diagnosis not present

## 2018-08-26 DIAGNOSIS — Z0001 Encounter for general adult medical examination with abnormal findings: Secondary | ICD-10-CM | POA: Diagnosis not present

## 2018-08-26 DIAGNOSIS — I10 Essential (primary) hypertension: Secondary | ICD-10-CM | POA: Diagnosis not present

## 2018-09-23 DIAGNOSIS — E669 Obesity, unspecified: Secondary | ICD-10-CM | POA: Diagnosis not present

## 2018-09-23 DIAGNOSIS — I10 Essential (primary) hypertension: Secondary | ICD-10-CM | POA: Diagnosis not present

## 2018-09-23 DIAGNOSIS — R972 Elevated prostate specific antigen [PSA]: Secondary | ICD-10-CM | POA: Diagnosis not present

## 2018-09-23 DIAGNOSIS — R7303 Prediabetes: Secondary | ICD-10-CM | POA: Diagnosis not present

## 2018-09-23 DIAGNOSIS — E782 Mixed hyperlipidemia: Secondary | ICD-10-CM | POA: Diagnosis not present

## 2018-09-23 DIAGNOSIS — Z87891 Personal history of nicotine dependence: Secondary | ICD-10-CM | POA: Diagnosis not present

## 2018-09-30 DIAGNOSIS — R972 Elevated prostate specific antigen [PSA]: Secondary | ICD-10-CM | POA: Diagnosis not present

## 2018-09-30 DIAGNOSIS — R3912 Poor urinary stream: Secondary | ICD-10-CM | POA: Diagnosis not present

## 2018-09-30 DIAGNOSIS — N5201 Erectile dysfunction due to arterial insufficiency: Secondary | ICD-10-CM | POA: Diagnosis not present

## 2018-12-08 DIAGNOSIS — M5441 Lumbago with sciatica, right side: Secondary | ICD-10-CM | POA: Diagnosis not present

## 2018-12-08 DIAGNOSIS — M9903 Segmental and somatic dysfunction of lumbar region: Secondary | ICD-10-CM | POA: Diagnosis not present

## 2018-12-08 DIAGNOSIS — M9905 Segmental and somatic dysfunction of pelvic region: Secondary | ICD-10-CM | POA: Diagnosis not present

## 2018-12-08 DIAGNOSIS — M9902 Segmental and somatic dysfunction of thoracic region: Secondary | ICD-10-CM | POA: Diagnosis not present

## 2018-12-10 DIAGNOSIS — M5441 Lumbago with sciatica, right side: Secondary | ICD-10-CM | POA: Diagnosis not present

## 2018-12-10 DIAGNOSIS — M9902 Segmental and somatic dysfunction of thoracic region: Secondary | ICD-10-CM | POA: Diagnosis not present

## 2018-12-10 DIAGNOSIS — M9905 Segmental and somatic dysfunction of pelvic region: Secondary | ICD-10-CM | POA: Diagnosis not present

## 2018-12-10 DIAGNOSIS — M9903 Segmental and somatic dysfunction of lumbar region: Secondary | ICD-10-CM | POA: Diagnosis not present

## 2018-12-17 DIAGNOSIS — M9903 Segmental and somatic dysfunction of lumbar region: Secondary | ICD-10-CM | POA: Diagnosis not present

## 2018-12-17 DIAGNOSIS — M5441 Lumbago with sciatica, right side: Secondary | ICD-10-CM | POA: Diagnosis not present

## 2018-12-17 DIAGNOSIS — M9902 Segmental and somatic dysfunction of thoracic region: Secondary | ICD-10-CM | POA: Diagnosis not present

## 2018-12-17 DIAGNOSIS — M9905 Segmental and somatic dysfunction of pelvic region: Secondary | ICD-10-CM | POA: Diagnosis not present

## 2018-12-31 DIAGNOSIS — M9902 Segmental and somatic dysfunction of thoracic region: Secondary | ICD-10-CM | POA: Diagnosis not present

## 2018-12-31 DIAGNOSIS — M9905 Segmental and somatic dysfunction of pelvic region: Secondary | ICD-10-CM | POA: Diagnosis not present

## 2018-12-31 DIAGNOSIS — M5441 Lumbago with sciatica, right side: Secondary | ICD-10-CM | POA: Diagnosis not present

## 2018-12-31 DIAGNOSIS — M9903 Segmental and somatic dysfunction of lumbar region: Secondary | ICD-10-CM | POA: Diagnosis not present

## 2019-01-01 DIAGNOSIS — I10 Essential (primary) hypertension: Secondary | ICD-10-CM | POA: Diagnosis not present

## 2019-01-01 DIAGNOSIS — E782 Mixed hyperlipidemia: Secondary | ICD-10-CM | POA: Diagnosis not present

## 2019-01-01 DIAGNOSIS — E669 Obesity, unspecified: Secondary | ICD-10-CM | POA: Diagnosis not present

## 2019-01-01 DIAGNOSIS — Z87891 Personal history of nicotine dependence: Secondary | ICD-10-CM | POA: Diagnosis not present

## 2019-01-01 DIAGNOSIS — R7303 Prediabetes: Secondary | ICD-10-CM | POA: Diagnosis not present

## 2019-03-30 DIAGNOSIS — R972 Elevated prostate specific antigen [PSA]: Secondary | ICD-10-CM | POA: Diagnosis not present

## 2019-04-06 DIAGNOSIS — R3912 Poor urinary stream: Secondary | ICD-10-CM | POA: Diagnosis not present

## 2019-04-06 DIAGNOSIS — R972 Elevated prostate specific antigen [PSA]: Secondary | ICD-10-CM | POA: Diagnosis not present

## 2019-05-21 DIAGNOSIS — E782 Mixed hyperlipidemia: Secondary | ICD-10-CM | POA: Diagnosis not present

## 2019-05-21 DIAGNOSIS — I10 Essential (primary) hypertension: Secondary | ICD-10-CM | POA: Diagnosis not present

## 2019-05-21 DIAGNOSIS — Z87891 Personal history of nicotine dependence: Secondary | ICD-10-CM | POA: Diagnosis not present

## 2019-05-21 DIAGNOSIS — R7303 Prediabetes: Secondary | ICD-10-CM | POA: Diagnosis not present

## 2019-05-21 DIAGNOSIS — R202 Paresthesia of skin: Secondary | ICD-10-CM | POA: Diagnosis not present

## 2019-05-21 DIAGNOSIS — Z01118 Encounter for examination of ears and hearing with other abnormal findings: Secondary | ICD-10-CM | POA: Diagnosis not present

## 2019-05-21 DIAGNOSIS — Z0001 Encounter for general adult medical examination with abnormal findings: Secondary | ICD-10-CM | POA: Diagnosis not present

## 2019-05-21 DIAGNOSIS — Z131 Encounter for screening for diabetes mellitus: Secondary | ICD-10-CM | POA: Diagnosis not present

## 2019-05-21 DIAGNOSIS — Z1329 Encounter for screening for other suspected endocrine disorder: Secondary | ICD-10-CM | POA: Diagnosis not present

## 2019-05-21 DIAGNOSIS — Z136 Encounter for screening for cardiovascular disorders: Secondary | ICD-10-CM | POA: Diagnosis not present

## 2019-05-21 DIAGNOSIS — E669 Obesity, unspecified: Secondary | ICD-10-CM | POA: Diagnosis not present

## 2019-08-06 DIAGNOSIS — E669 Obesity, unspecified: Secondary | ICD-10-CM | POA: Diagnosis not present

## 2019-08-06 DIAGNOSIS — Z0001 Encounter for general adult medical examination with abnormal findings: Secondary | ICD-10-CM | POA: Diagnosis not present

## 2019-08-06 DIAGNOSIS — Z87891 Personal history of nicotine dependence: Secondary | ICD-10-CM | POA: Diagnosis not present

## 2019-08-06 DIAGNOSIS — E782 Mixed hyperlipidemia: Secondary | ICD-10-CM | POA: Diagnosis not present

## 2019-08-06 DIAGNOSIS — I10 Essential (primary) hypertension: Secondary | ICD-10-CM | POA: Diagnosis not present

## 2019-08-06 DIAGNOSIS — R21 Rash and other nonspecific skin eruption: Secondary | ICD-10-CM | POA: Diagnosis not present

## 2019-08-06 DIAGNOSIS — Z1329 Encounter for screening for other suspected endocrine disorder: Secondary | ICD-10-CM | POA: Diagnosis not present

## 2019-08-06 DIAGNOSIS — Z01118 Encounter for examination of ears and hearing with other abnormal findings: Secondary | ICD-10-CM | POA: Diagnosis not present

## 2019-08-06 DIAGNOSIS — R202 Paresthesia of skin: Secondary | ICD-10-CM | POA: Diagnosis not present

## 2019-08-06 DIAGNOSIS — R7303 Prediabetes: Secondary | ICD-10-CM | POA: Diagnosis not present

## 2019-08-06 DIAGNOSIS — Z131 Encounter for screening for diabetes mellitus: Secondary | ICD-10-CM | POA: Diagnosis not present

## 2019-08-27 DIAGNOSIS — E669 Obesity, unspecified: Secondary | ICD-10-CM | POA: Diagnosis not present

## 2019-08-27 DIAGNOSIS — I10 Essential (primary) hypertension: Secondary | ICD-10-CM | POA: Diagnosis not present

## 2019-08-27 DIAGNOSIS — R7303 Prediabetes: Secondary | ICD-10-CM | POA: Diagnosis not present

## 2019-08-27 DIAGNOSIS — Z87891 Personal history of nicotine dependence: Secondary | ICD-10-CM | POA: Diagnosis not present

## 2019-08-27 DIAGNOSIS — Z23 Encounter for immunization: Secondary | ICD-10-CM | POA: Diagnosis not present

## 2019-08-27 DIAGNOSIS — E782 Mixed hyperlipidemia: Secondary | ICD-10-CM | POA: Diagnosis not present

## 2019-08-27 DIAGNOSIS — Z0001 Encounter for general adult medical examination with abnormal findings: Secondary | ICD-10-CM | POA: Diagnosis not present

## 2019-08-27 DIAGNOSIS — R202 Paresthesia of skin: Secondary | ICD-10-CM | POA: Diagnosis not present

## 2019-09-07 ENCOUNTER — Other Ambulatory Visit: Payer: Self-pay

## 2019-10-05 DIAGNOSIS — R972 Elevated prostate specific antigen [PSA]: Secondary | ICD-10-CM | POA: Diagnosis not present

## 2019-10-29 DIAGNOSIS — R3912 Poor urinary stream: Secondary | ICD-10-CM | POA: Diagnosis not present

## 2019-10-29 DIAGNOSIS — R972 Elevated prostate specific antigen [PSA]: Secondary | ICD-10-CM | POA: Diagnosis not present

## 2020-02-18 DIAGNOSIS — Z1329 Encounter for screening for other suspected endocrine disorder: Secondary | ICD-10-CM | POA: Diagnosis not present

## 2020-02-18 DIAGNOSIS — Z136 Encounter for screening for cardiovascular disorders: Secondary | ICD-10-CM | POA: Diagnosis not present

## 2020-02-18 DIAGNOSIS — Z5181 Encounter for therapeutic drug level monitoring: Secondary | ICD-10-CM | POA: Diagnosis not present

## 2020-02-18 DIAGNOSIS — E782 Mixed hyperlipidemia: Secondary | ICD-10-CM | POA: Diagnosis not present

## 2020-02-18 DIAGNOSIS — Z0001 Encounter for general adult medical examination with abnormal findings: Secondary | ICD-10-CM | POA: Diagnosis not present

## 2020-02-18 DIAGNOSIS — R7303 Prediabetes: Secondary | ICD-10-CM | POA: Diagnosis not present

## 2020-02-18 DIAGNOSIS — E669 Obesity, unspecified: Secondary | ICD-10-CM | POA: Diagnosis not present

## 2020-02-18 DIAGNOSIS — Z87891 Personal history of nicotine dependence: Secondary | ICD-10-CM | POA: Diagnosis not present

## 2020-02-18 DIAGNOSIS — Z01118 Encounter for examination of ears and hearing with other abnormal findings: Secondary | ICD-10-CM | POA: Diagnosis not present

## 2020-02-18 DIAGNOSIS — I10 Essential (primary) hypertension: Secondary | ICD-10-CM | POA: Diagnosis not present

## 2020-02-18 DIAGNOSIS — Z131 Encounter for screening for diabetes mellitus: Secondary | ICD-10-CM | POA: Diagnosis not present

## 2020-02-18 DIAGNOSIS — Z01021 Encounter for examination of eyes and vision following failed vision screening with abnormal findings: Secondary | ICD-10-CM | POA: Diagnosis not present

## 2020-02-18 DIAGNOSIS — Z1389 Encounter for screening for other disorder: Secondary | ICD-10-CM | POA: Diagnosis not present

## 2020-03-17 DIAGNOSIS — E669 Obesity, unspecified: Secondary | ICD-10-CM | POA: Diagnosis not present

## 2020-03-17 DIAGNOSIS — Z87891 Personal history of nicotine dependence: Secondary | ICD-10-CM | POA: Diagnosis not present

## 2020-03-17 DIAGNOSIS — E782 Mixed hyperlipidemia: Secondary | ICD-10-CM | POA: Diagnosis not present

## 2020-03-17 DIAGNOSIS — I10 Essential (primary) hypertension: Secondary | ICD-10-CM | POA: Diagnosis not present

## 2020-03-17 DIAGNOSIS — R7303 Prediabetes: Secondary | ICD-10-CM | POA: Diagnosis not present

## 2020-06-07 DIAGNOSIS — Z20822 Contact with and (suspected) exposure to covid-19: Secondary | ICD-10-CM | POA: Diagnosis not present

## 2020-06-07 DIAGNOSIS — Z03818 Encounter for observation for suspected exposure to other biological agents ruled out: Secondary | ICD-10-CM | POA: Diagnosis not present

## 2020-06-09 DIAGNOSIS — U071 COVID-19: Secondary | ICD-10-CM | POA: Diagnosis not present

## 2020-06-09 DIAGNOSIS — I10 Essential (primary) hypertension: Secondary | ICD-10-CM | POA: Diagnosis not present

## 2020-06-09 DIAGNOSIS — R7303 Prediabetes: Secondary | ICD-10-CM | POA: Diagnosis not present

## 2020-06-09 DIAGNOSIS — E669 Obesity, unspecified: Secondary | ICD-10-CM | POA: Diagnosis not present

## 2020-06-09 DIAGNOSIS — Z87891 Personal history of nicotine dependence: Secondary | ICD-10-CM | POA: Diagnosis not present

## 2020-06-09 DIAGNOSIS — E782 Mixed hyperlipidemia: Secondary | ICD-10-CM | POA: Diagnosis not present

## 2020-06-13 DIAGNOSIS — Z03818 Encounter for observation for suspected exposure to other biological agents ruled out: Secondary | ICD-10-CM | POA: Diagnosis not present

## 2020-06-13 DIAGNOSIS — Z20822 Contact with and (suspected) exposure to covid-19: Secondary | ICD-10-CM | POA: Diagnosis not present

## 2020-06-17 ENCOUNTER — Encounter (HOSPITAL_COMMUNITY): Payer: Self-pay | Admitting: *Deleted

## 2020-06-17 ENCOUNTER — Other Ambulatory Visit: Payer: Self-pay

## 2020-06-17 ENCOUNTER — Emergency Department (HOSPITAL_COMMUNITY)
Admission: EM | Admit: 2020-06-17 | Discharge: 2020-06-17 | Disposition: A | Discharge status: Home / Self Care | Payer: PPO | Attending: Emergency Medicine | Admitting: Emergency Medicine

## 2020-06-17 ENCOUNTER — Emergency Department (HOSPITAL_COMMUNITY): Payer: PPO

## 2020-06-17 DIAGNOSIS — R5383 Other fatigue: Secondary | ICD-10-CM | POA: Diagnosis not present

## 2020-06-17 DIAGNOSIS — R634 Abnormal weight loss: Secondary | ICD-10-CM | POA: Diagnosis not present

## 2020-06-17 DIAGNOSIS — Z79899 Other long term (current) drug therapy: Secondary | ICD-10-CM | POA: Diagnosis not present

## 2020-06-17 DIAGNOSIS — I10 Essential (primary) hypertension: Secondary | ICD-10-CM | POA: Insufficient documentation

## 2020-06-17 DIAGNOSIS — Z87891 Personal history of nicotine dependence: Secondary | ICD-10-CM | POA: Diagnosis not present

## 2020-06-17 DIAGNOSIS — R05 Cough: Secondary | ICD-10-CM | POA: Diagnosis present

## 2020-06-17 DIAGNOSIS — U071 COVID-19: Secondary | ICD-10-CM | POA: Insufficient documentation

## 2020-06-17 LAB — CBC
HCT: 46.2 % (ref 39.0–52.0)
Hemoglobin: 15.6 g/dL (ref 13.0–17.0)
MCH: 29.1 pg (ref 26.0–34.0)
MCHC: 33.8 g/dL (ref 30.0–36.0)
MCV: 86.2 fL (ref 80.0–100.0)
Platelets: 126 10*3/uL — ABNORMAL LOW (ref 150–400)
RBC: 5.36 MIL/uL (ref 4.22–5.81)
RDW: 12.3 % (ref 11.5–15.5)
WBC: 4.8 10*3/uL (ref 4.0–10.5)
nRBC: 0 % (ref 0.0–0.2)

## 2020-06-17 LAB — COMPREHENSIVE METABOLIC PANEL
ALT: 47 U/L — ABNORMAL HIGH (ref 0–44)
AST: 36 U/L (ref 15–41)
Albumin: 3.6 g/dL (ref 3.5–5.0)
Alkaline Phosphatase: 61 U/L (ref 38–126)
Anion gap: 11 (ref 5–15)
BUN: 27 mg/dL — ABNORMAL HIGH (ref 8–23)
CO2: 25 mmol/L (ref 22–32)
Calcium: 8.5 mg/dL — ABNORMAL LOW (ref 8.9–10.3)
Chloride: 95 mmol/L — ABNORMAL LOW (ref 98–111)
Creatinine, Ser: 1.49 mg/dL — ABNORMAL HIGH (ref 0.61–1.24)
GFR calc Af Amer: 52 mL/min — ABNORMAL LOW (ref >60)
GFR calc non Af Amer: 45 mL/min — ABNORMAL LOW (ref >60)
Glucose, Bld: 111 mg/dL — ABNORMAL HIGH (ref 70–99)
Potassium: 4 mmol/L (ref 3.5–5.1)
Sodium: 131 mmol/L — ABNORMAL LOW (ref 135–145)
Total Bilirubin: 0.9 mg/dL (ref 0.3–1.2)
Total Protein: 6.7 g/dL (ref 6.5–8.1)

## 2020-06-17 LAB — LIPASE, BLOOD: Lipase: 39 U/L (ref 11–51)

## 2020-06-17 MED ORDER — SODIUM CHLORIDE 0.9 % IV BOLUS: 500.0000 mL | Freq: Once | INTRAVENOUS | Status: DC

## 2020-06-17 MED ORDER — ONDANSETRON HCL 4 MG/2ML IJ SOLN
4.0000 mg | Freq: Once | INTRAMUSCULAR | Status: DC
  Filled 2020-06-17: qty 2

## 2020-06-17 MED ORDER — ONDANSETRON 4 MG PO TBDP: 4.0000 mg | ORAL_TABLET | Freq: Three times a day (TID) | ORAL | 0 refills | Status: DC | PRN

## 2020-06-17 NOTE — ED Notes (Signed)
Pt d/c home per MD order. Discharge summary reviewed with pt, pt verbalizes understanding. Off unit via WC- Pt daughter here for discharge ride home. No s/s of acute distress noted.

## 2020-06-17 NOTE — ED Triage Notes (Signed)
Pt reports family member being admitted for covid, pt has been tested twice and tested +. Pt having decreased appetite, fatigue, weight loss. Denies fever or sob. spo2 96%. Reports being approx 10 days since onset.

## 2020-06-17 NOTE — ED Notes (Signed)
Pulse ox while ambulating 100% °

## 2020-06-17 NOTE — ED Provider Notes (Signed)
[facility] EMERGENCY DEPARTMENT Provider Note   CSN: [id number] Arrival date & time: 06/17/20  1009     History Chief Complaint  Patient presents with  . Covid Positive  . Nausea    Labron Bloodgood is a 74 y.o. male.  HPI   74 year old male presenting for evaluation of Covid symptoms. Patient states that his wife recently tested positive for Covid. He states he was tested on 8/24 and results were positive. He complains of fatigue, and intermittent cough, decreased appetite and a 10 pound weight loss during this time. He denies any shortness of breath, chest pain. He denies any vomiting. He has had some diarrhea.  Patient states he has been being treated by his PCP and is currently on cough medication and prednisone and one other medication that he is not sure of the name. States these do not seem to be improving his symptoms. He is asking about ivermectin and hydrochloric when stating he feels these will help his symptoms.  Past Medical History:  Diagnosis Date  . Hypertension     There are no problems to display for this patient.   Past Surgical History:  Procedure Laterality Date  . COLONOSCOPY WITH PROPOFOL N/A 08/28/2016   Procedure: COLONOSCOPY WITH PROPOFOL;  Surgeon: Charolett Bumpers, MD;  Location: [facility] ENDOSCOPY;  Service: Endoscopy;  Laterality: N/A;  . ESOPHAGOGASTRODUODENOSCOPY N/A 08/28/2016   Procedure: ESOPHAGOGASTRODUODENOSCOPY (EGD);  Surgeon: Charolett Bumpers, MD;  Location: [facility];  Service: Endoscopy;  Laterality: N/A;  . TONSILLECTOMY         History reviewed. No pertinent family history.  Social History   Tobacco Use  . Smoking status: Former Smoker    Quit date: 11/24/1980    Years since quitting: 39.5  . Smokeless tobacco: Never Used  Substance Use Topics  . Alcohol use: Yes    Comment: daily  . Drug use: No    Home Medications Prior to Admission medications   Medication Sig Start Date End Date Taking? Authorizing  Provider  atorvastatin (LIPITOR) 10 MG tablet Take 10 mg by mouth daily.    [provider]  Cholecalciferol (VITAMIN D PO) Take 1 capsule by mouth daily.    [provider]  diltiazem (TIAZAC) 120 MG 24 hr capsule Take 120 mg by mouth daily. 07/31/16   [provider]  hydrochlorothiazide (HYDRODIURIL) 25 MG tablet Take 25 mg by mouth 3 (three) times a week.    [provider]  ibuprofen (ADVIL,MOTRIN) 200 MG tablet Take 600 mg by mouth every 6 (six) hours as needed for moderate pain.    [provider]  ibuprofen (ADVIL,MOTRIN) 800 MG tablet Take 1 tablet (800 mg total) by mouth every 8 (eight) hours as needed. Patient not taking: Reported on 08/22/2016 04/08/16   Charlestine Night, PA-C  Multiple Vitamin (MULTIVITAMIN WITH MINERALS) TABS tablet Take 1 tablet by mouth daily.    [provider]  Omega-3 Fatty Acids (FISH OIL PO) Take 1 capsule by mouth daily.    [provider]  ondansetron (ZOFRAN ODT) 4 MG disintegrating tablet Take 1 tablet (4 mg total) by mouth every 8 (eight) hours as needed for nausea or vomiting. 06/17/20   Alvira Monday, MD  Probiotic CAPS Take 1 capsule by mouth daily.    [provider]    Allergies    Patient has no known allergies.  Review of Systems   Review of Systems  Constitutional: Positive for appetite change, fatigue and unexpected weight  change. Negative for fever.  HENT: Negative for ear pain and sore throat.   Eyes: Negative for pain and visual disturbance.  Respiratory: Positive for cough. Negative for shortness of breath.   Cardiovascular: Negative for chest pain.  Gastrointestinal: Positive for diarrhea and nausea. Negative for abdominal pain and vomiting.  Genitourinary: Negative for dysuria.  Musculoskeletal: Negative for back pain.  Skin: Negative for rash.  Neurological: Negative for syncope.  All other systems reviewed and are negative.   Physical Exam Updated  Vital Signs BP 115/65   Pulse 77   Temp 98.6 F (37 C) (Oral)   Resp 17   Ht 5\' 6"  (1.676 m)   Wt 77.1 kg   SpO2 99%   BMI 27.44 kg/m   Physical Exam Vitals and nursing note reviewed.  Constitutional:      Appearance: He is well-developed.  HENT:     Head: Normocephalic and atraumatic.     Mouth/Throat:     Mouth: Mucous membranes are dry.  Eyes:     Conjunctiva/sclera: Conjunctivae normal.  Cardiovascular:     Rate and Rhythm: Normal rate and regular rhythm.     Heart sounds: Normal heart sounds. No murmur heard.   Pulmonary:     Effort: Pulmonary effort is normal. No respiratory distress.     Breath sounds: Rales (bibasilar rales) present. No wheezing or rhonchi.  Abdominal:     General: Bowel sounds are normal.     Palpations: Abdomen is soft.     Tenderness: There is no abdominal tenderness. There is no guarding or rebound.  Musculoskeletal:     Cervical back: Neck supple.  Skin:    General: Skin is warm and dry.  Neurological:     Mental Status: He is alert.     ED Results / Procedures / Treatments   Labs (all labs ordered are listed, but only abnormal results are displayed) Labs Reviewed  COMPREHENSIVE METABOLIC PANEL - Abnormal; Notable for the following components:      Result Value   Sodium 131 (*)    Chloride 95 (*)    Glucose, Bld 111 (*)    BUN 27 (*)    Creatinine, Ser 1.49 (*)    Calcium 8.5 (*)    ALT 47 (*)    GFR calc non Af Amer 45 (*)    GFR calc Af Amer 52 (*)    All other components within normal limits  CBC - Abnormal; Notable for the following components:   Platelets 126 (*)    All other components within normal limits  LIPASE, BLOOD    EKG EKG Interpretation  Date/Time:  Friday June 17 2020 10:19:20 EDT Ventricular Rate:  91 PR Interval:  154 QRS Duration: 116 QT Interval:  372 QTC Calculation: 457 R Axis:   -24 Text Interpretation: Normal sinus rhythm Incomplete right bundle branch block Minimal voltage criteria  for LVH, may be normal variant ( R in aVL ) Borderline ECG No previous ECGs available Confirmed by [id number] (Alvira Monday) on 06/17/2020 12:04:58 PM   Radiology DG Chest Portable 1 View  Result Date: 06/17/2020 CLINICAL DATA:  Fatigue, weight loss.  Positive for COVID. EXAM: PORTABLE CHEST 1 VIEW COMPARISON:  CT chest 04/08/2016 FINDINGS: The heart size and mediastinal contours are within normal limits. Aortic atherosclerotic calcification noted. Both lungs are clear. The visualized skeletal structures are unremarkable. IMPRESSION: 1. No acute cardiopulmonary abnormalities. 2.  Aortic Atherosclerosis (ICD10-I70.0). Electronically Signed   By: 04/10/2016.D.  On: 06/17/2020 12:55    Procedures Procedures (including critical care time)  Medications Ordered in ED Medications - No data to display  ED Course  I have reviewed the triage vital signs and the nursing notes.  Pertinent labs & imaging results that were available during my care of the patient were reviewed by me and considered in my medical decision making (see chart for details).    MDM Rules/Calculators/A&P                          74 year old male presenting for evaluation of Covid symptoms. Mainly complaining of nausea, decreased p.o. intake and weight loss. He denies any chest pain or shortness of breath at the time of my evaluation. He is requesting plaquenil and/or ivermectin. We had a discussion about how these medications are not currently recommended to be given due to concern for potential side effects and ongoing clinical research which has yet to demonstrate a clear effect on morbidity/mortality.  Work-up will be initiated with labs, chest x-ray. Also order a small fluid bolus as patient appears dehydrated.  Reviewed/interpreted w/o CBC with mild thrombocytopenia, otherwise reassuring CMP with mild hyponatremia, hypochloremia, elevated BUN/creatinine which is worse compared to prior however labs are completed 4  years ago.  ALT slightly elevated which would be expected in Covid. Lipase negative  EKG with Normal sinus rhythm Incomplete right bundle branch block Minimal voltage criteria for LVH, may be normal variant ( R in aVL ) Borderline ECG No previous ECGs available  CXR - 1. No acute cardiopulmonary abnormalities. 2.  Aortic Atherosclerosis  Pt was offered zofran and IVF. He ambulated in his room and maintained sats at 100% on RA.   Pt was then evaluated by my supervising physician, Dr. Dalene Seltzer. She had a long discussion with patient and ultimately decided to d/c patient home with close pcp f/u given his reassuring w/u today.    Final Clinical Impression(s) / ED Diagnoses Final diagnoses:  COVID-19    Rx / DC Orders ED Discharge Orders         Ordered    ondansetron (ZOFRAN ODT) 4 MG disintegrating tablet  Every 8 hours PRN        06/17/20 1315           Niyla Marone S, PA-C 06/17/20 1317    Alvira Monday, MD 06/20/20 1027

## 2020-06-21 DIAGNOSIS — E669 Obesity, unspecified: Secondary | ICD-10-CM | POA: Diagnosis not present

## 2020-06-21 DIAGNOSIS — U071 COVID-19: Secondary | ICD-10-CM | POA: Diagnosis not present

## 2020-06-21 DIAGNOSIS — I10 Essential (primary) hypertension: Secondary | ICD-10-CM | POA: Diagnosis not present

## 2020-06-21 DIAGNOSIS — E782 Mixed hyperlipidemia: Secondary | ICD-10-CM | POA: Diagnosis not present

## 2020-06-21 DIAGNOSIS — R7303 Prediabetes: Secondary | ICD-10-CM | POA: Diagnosis not present

## 2020-06-21 DIAGNOSIS — Z87891 Personal history of nicotine dependence: Secondary | ICD-10-CM | POA: Diagnosis not present

## 2020-06-24 DIAGNOSIS — Z03818 Encounter for observation for suspected exposure to other biological agents ruled out: Secondary | ICD-10-CM | POA: Diagnosis not present

## 2020-06-24 DIAGNOSIS — Z20822 Contact with and (suspected) exposure to covid-19: Secondary | ICD-10-CM | POA: Diagnosis not present

## 2020-07-28 DIAGNOSIS — E669 Obesity, unspecified: Secondary | ICD-10-CM | POA: Diagnosis not present

## 2020-07-28 DIAGNOSIS — I10 Essential (primary) hypertension: Secondary | ICD-10-CM | POA: Diagnosis not present

## 2020-07-28 DIAGNOSIS — Z8616 Personal history of COVID-19: Secondary | ICD-10-CM | POA: Diagnosis not present

## 2020-07-28 DIAGNOSIS — Z87891 Personal history of nicotine dependence: Secondary | ICD-10-CM | POA: Diagnosis not present

## 2020-07-28 DIAGNOSIS — E782 Mixed hyperlipidemia: Secondary | ICD-10-CM | POA: Diagnosis not present

## 2020-07-28 DIAGNOSIS — R7303 Prediabetes: Secondary | ICD-10-CM | POA: Diagnosis not present

## 2020-08-29 DIAGNOSIS — E782 Mixed hyperlipidemia: Secondary | ICD-10-CM | POA: Diagnosis not present

## 2020-08-29 DIAGNOSIS — R7303 Prediabetes: Secondary | ICD-10-CM | POA: Diagnosis not present

## 2020-08-29 DIAGNOSIS — Z0001 Encounter for general adult medical examination with abnormal findings: Secondary | ICD-10-CM | POA: Diagnosis not present

## 2020-08-29 DIAGNOSIS — Z87891 Personal history of nicotine dependence: Secondary | ICD-10-CM | POA: Diagnosis not present

## 2020-08-29 DIAGNOSIS — I10 Essential (primary) hypertension: Secondary | ICD-10-CM | POA: Diagnosis not present

## 2020-08-29 DIAGNOSIS — E669 Obesity, unspecified: Secondary | ICD-10-CM | POA: Diagnosis not present

## 2021-02-16 DIAGNOSIS — E6609 Other obesity due to excess calories: Secondary | ICD-10-CM | POA: Diagnosis not present

## 2021-02-16 DIAGNOSIS — R202 Paresthesia of skin: Secondary | ICD-10-CM | POA: Diagnosis not present

## 2021-02-16 DIAGNOSIS — R7303 Prediabetes: Secondary | ICD-10-CM | POA: Diagnosis not present

## 2021-02-16 DIAGNOSIS — Z0001 Encounter for general adult medical examination with abnormal findings: Secondary | ICD-10-CM | POA: Diagnosis not present

## 2021-02-16 DIAGNOSIS — I10 Essential (primary) hypertension: Secondary | ICD-10-CM | POA: Diagnosis not present

## 2021-02-16 DIAGNOSIS — M79601 Pain in right arm: Secondary | ICD-10-CM | POA: Diagnosis not present

## 2021-02-16 DIAGNOSIS — Z125 Encounter for screening for malignant neoplasm of prostate: Secondary | ICD-10-CM | POA: Diagnosis not present

## 2021-02-16 DIAGNOSIS — Z6830 Body mass index (BMI) 30.0-30.9, adult: Secondary | ICD-10-CM | POA: Diagnosis not present

## 2021-02-16 DIAGNOSIS — M79602 Pain in left arm: Secondary | ICD-10-CM | POA: Diagnosis not present

## 2021-02-16 DIAGNOSIS — E782 Mixed hyperlipidemia: Secondary | ICD-10-CM | POA: Diagnosis not present

## 2021-02-16 DIAGNOSIS — Z87891 Personal history of nicotine dependence: Secondary | ICD-10-CM | POA: Diagnosis not present

## 2021-02-27 ENCOUNTER — Other Ambulatory Visit: Payer: Self-pay

## 2021-02-27 ENCOUNTER — Encounter (HOSPITAL_COMMUNITY): Payer: Self-pay | Admitting: *Deleted

## 2021-02-27 ENCOUNTER — Emergency Department (HOSPITAL_COMMUNITY): Payer: PPO

## 2021-02-27 ENCOUNTER — Emergency Department (HOSPITAL_COMMUNITY)
Admission: EM | Admit: 2021-02-27 | Discharge: 2021-02-27 | Disposition: A | Discharge status: Home / Self Care | Payer: PPO | Attending: Emergency Medicine | Admitting: Emergency Medicine

## 2021-02-27 DIAGNOSIS — Z87891 Personal history of nicotine dependence: Secondary | ICD-10-CM | POA: Diagnosis not present

## 2021-02-27 DIAGNOSIS — R109 Unspecified abdominal pain: Secondary | ICD-10-CM | POA: Diagnosis not present

## 2021-02-27 DIAGNOSIS — K4090 Unilateral inguinal hernia, without obstruction or gangrene, not specified as recurrent: Secondary | ICD-10-CM | POA: Diagnosis not present

## 2021-02-27 DIAGNOSIS — I10 Essential (primary) hypertension: Secondary | ICD-10-CM | POA: Diagnosis not present

## 2021-02-27 DIAGNOSIS — Z79899 Other long term (current) drug therapy: Secondary | ICD-10-CM | POA: Diagnosis not present

## 2021-02-27 DIAGNOSIS — R112 Nausea with vomiting, unspecified: Secondary | ICD-10-CM | POA: Diagnosis not present

## 2021-02-27 DIAGNOSIS — N200 Calculus of kidney: Secondary | ICD-10-CM | POA: Diagnosis not present

## 2021-02-27 DIAGNOSIS — M4319 Spondylolisthesis, multiple sites in spine: Secondary | ICD-10-CM | POA: Diagnosis not present

## 2021-02-27 DIAGNOSIS — M47816 Spondylosis without myelopathy or radiculopathy, lumbar region: Secondary | ICD-10-CM | POA: Diagnosis not present

## 2021-02-27 DIAGNOSIS — N132 Hydronephrosis with renal and ureteral calculous obstruction: Secondary | ICD-10-CM | POA: Diagnosis not present

## 2021-02-27 LAB — COMPREHENSIVE METABOLIC PANEL
ALT: 18 U/L (ref 0–44)
AST: 25 U/L (ref 15–41)
Albumin: 4 g/dL (ref 3.5–5.0)
Alkaline Phosphatase: 68 U/L (ref 38–126)
Anion gap: 8 (ref 5–15)
BUN: 21 mg/dL (ref 8–23)
CO2: 26 mmol/L (ref 22–32)
Calcium: 9.4 mg/dL (ref 8.9–10.3)
Chloride: 101 mmol/L (ref 98–111)
Creatinine, Ser: 1.24 mg/dL (ref 0.61–1.24)
GFR, Estimated: >60 mL/min (ref >60)
Glucose, Bld: 121 mg/dL — ABNORMAL HIGH (ref 70–99)
Potassium: 4.2 mmol/L (ref 3.5–5.1)
Sodium: 135 mmol/L (ref 135–145)
Total Bilirubin: 0.9 mg/dL (ref 0.3–1.2)
Total Protein: 6.8 g/dL (ref 6.5–8.1)

## 2021-02-27 LAB — CBC WITH DIFFERENTIAL/PLATELET
Abs Immature Granulocytes: 0.05 10*3/uL (ref 0.00–0.07)
Basophils Absolute: 0 10*3/uL (ref 0.0–0.1)
Basophils Relative: 0 %
Eosinophils Absolute: 0 10*3/uL (ref 0.0–0.5)
Eosinophils Relative: 0 %
HCT: 44 % (ref 39.0–52.0)
Hemoglobin: 15 g/dL (ref 13.0–17.0)
Immature Granulocytes: 0 %
Lymphocytes Relative: 7 %
Lymphs Abs: 0.8 10*3/uL (ref 0.7–4.0)
MCH: 30.4 pg (ref 26.0–34.0)
MCHC: 34.1 g/dL (ref 30.0–36.0)
MCV: 89.2 fL (ref 80.0–100.0)
Monocytes Absolute: 0.5 10*3/uL (ref 0.1–1.0)
Monocytes Relative: 4 %
Neutro Abs: 11 10*3/uL — ABNORMAL HIGH (ref 1.7–7.7)
Neutrophils Relative %: 89 %
Platelets: 159 10*3/uL (ref 150–400)
RBC: 4.93 MIL/uL (ref 4.22–5.81)
RDW: 12.9 % (ref 11.5–15.5)
WBC: 12.4 10*3/uL — ABNORMAL HIGH (ref 4.0–10.5)
nRBC: 0 % (ref 0.0–0.2)

## 2021-02-27 LAB — URINALYSIS, ROUTINE W REFLEX MICROSCOPIC
Bacteria, UA: NONE SEEN
Bilirubin Urine: NEGATIVE
Glucose, UA: NEGATIVE mg/dL
Ketones, ur: 5 mg/dL — AB
Leukocytes,Ua: NEGATIVE
Nitrite: NEGATIVE
Protein, ur: NEGATIVE mg/dL
Specific Gravity, Urine: 1.01 (ref 1.005–1.030)
pH: 6 (ref 5.0–8.0)

## 2021-02-27 MED ORDER — OXYCODONE-ACETAMINOPHEN 5-325 MG PO TABS: 1.0000 | ORAL_TABLET | Freq: Three times a day (TID) | ORAL | 0 refills | Status: AC | PRN

## 2021-02-27 MED ORDER — ONDANSETRON 4 MG PO TBDP: 4.0000 mg | ORAL_TABLET | Freq: Three times a day (TID) | ORAL | 0 refills | Status: AC | PRN

## 2021-02-27 MED ORDER — TAMSULOSIN HCL 0.4 MG PO CAPS: 0.4000 mg | ORAL_CAPSULE | Freq: Every day | ORAL | 0 refills | Status: AC

## 2021-02-27 NOTE — ED Notes (Signed)
Attempted to call pt wife to let her know he was being discharged. There was no answer. Pt out to lobby at this time.

## 2021-02-27 NOTE — ED Provider Notes (Signed)
[facility] EMERGENCY DEPARTMENT Provider Note   CSN: [id number] Arrival date & time: 02/27/21  1709     History Chief Complaint  Patient presents with  . Abdominal Pain    Joseph Sheppard is a 75 y.o. male presenting for evaluation of abd pain, n/v.   Pt states 2 days ago he had a twinge in the right side of his back, which resolved with time.  He was feeling well yesterday.  Today he had similar twinge, but then developed severe right-sided flank/lower abdominal pain, nausea, vomiting.  This lasted for 30 to 60 minutes before it resolved without intervention.  He has been feeling well since then.  He has urinated since without difficulty, denies dysuria or urinary frequency.  He denies fevers or chills.  No chest pain, shortness of breath, or abnormal bowel movements.  He has a history of kidney stones, but last one was about 25 years ago.  He does not have a urologist locally.  He has not taken anything for pain today.  HPI     Past Medical History:  Diagnosis Date  . Hypertension     There are no problems to display for this patient.   Past Surgical History:  Procedure Laterality Date  . COLONOSCOPY WITH PROPOFOL N/A 08/28/2016   Procedure: COLONOSCOPY WITH PROPOFOL;  Surgeon: Charolett Bumpers, MD;  Location: [facility] ENDOSCOPY;  Service: Endoscopy;  Laterality: N/A;  . ESOPHAGOGASTRODUODENOSCOPY N/A 08/28/2016   Procedure: ESOPHAGOGASTRODUODENOSCOPY (EGD);  Surgeon: Charolett Bumpers, MD;  Location: [facility];  Service: Endoscopy;  Laterality: N/A;  . TONSILLECTOMY         History reviewed. No pertinent family history.  Social History   Tobacco Use  . Smoking status: Former Smoker    Quit date: 11/24/1980    Years since quitting: 40.2  . Smokeless tobacco: Never Used  Substance Use Topics  . Alcohol use: Yes    Comment: daily  . Drug use: No    Home Medications Prior to Admission medications   Medication Sig Start Date End Date Taking?  Authorizing Provider  atorvastatin (LIPITOR) 10 MG tablet Take 10 mg by mouth daily.    [provider]  Cholecalciferol (VITAMIN D PO) Take 1 capsule by mouth daily.    [provider]  diltiazem (TIAZAC) 120 MG 24 hr capsule Take 120 mg by mouth daily. 07/31/16   [provider]  hydrochlorothiazide (HYDRODIURIL) 25 MG tablet Take 25 mg by mouth 3 (three) times a week.    [provider]  ibuprofen (ADVIL,MOTRIN) 200 MG tablet Take 600 mg by mouth every 6 (six) hours as needed for moderate pain.    [provider]  ibuprofen (ADVIL,MOTRIN) 800 MG tablet Take 1 tablet (800 mg total) by mouth every 8 (eight) hours as needed. Patient not taking: Reported on 08/22/2016 04/08/16   Charlestine Night, PA-C  Multiple Vitamin (MULTIVITAMIN WITH MINERALS) TABS tablet Take 1 tablet by mouth daily.    [provider]  Omega-3 Fatty Acids (FISH OIL PO) Take 1 capsule by mouth daily.    [provider]  ondansetron (ZOFRAN ODT) 4 MG disintegrating tablet Take 1 tablet (4 mg total) by mouth every 8 (eight) hours as needed for nausea or vomiting. 06/17/20   Alvira Monday, MD  Probiotic CAPS Take 1 capsule by mouth daily.    [provider]    Allergies    Patient has no known allergies.  Review of Systems   Review of  Systems  Gastrointestinal: Positive for abdominal pain, nausea and vomiting.  Genitourinary: Positive for flank pain.  All other systems reviewed and are negative.   Physical Exam Updated Vital Signs BP (!) 164/98 (BP Location: Right Arm)   Pulse 96   Temp 98.4 F (36.9 C) (Oral)   Resp (!) 24   SpO2 100%   Physical Exam Vitals and nursing note reviewed.  Constitutional:      General: He is not in acute distress.    Appearance: He is well-developed.     Comments: Appears nontoxic  HENT:     Head: Normocephalic and atraumatic.  Eyes:     Extraocular Movements: Extraocular movements intact.      Conjunctiva/sclera: Conjunctivae normal.     Pupils: Pupils are equal, round, and reactive to light.  Cardiovascular:     Rate and Rhythm: Normal rate and regular rhythm.     Pulses: Normal pulses.  Pulmonary:     Effort: Pulmonary effort is normal. No respiratory distress.     Breath sounds: Normal breath sounds. No wheezing.  Abdominal:     General: There is no distension.     Palpations: Abdomen is soft. There is no mass.     Tenderness: There is no abdominal tenderness. There is no guarding or rebound.     Comments: No ttp of the abd. No rigidity or distention. Negative rebound. No cva tenderness  Musculoskeletal:        General: Normal range of motion.     Cervical back: Normal range of motion and neck supple.  Skin:    General: Skin is warm and dry.     Capillary Refill: Capillary refill takes less than 2 seconds.  Neurological:     Mental Status: He is alert and oriented to person, place, and time.     ED Results / Procedures / Treatments   Labs (all labs ordered are listed, but only abnormal results are displayed) Labs Reviewed  CBC WITH DIFFERENTIAL/PLATELET - Abnormal; Notable for the following components:      Result Value   WBC 12.4 (*)    Neutro Abs 11.0 (*)    All other components within normal limits  COMPREHENSIVE METABOLIC PANEL - Abnormal; Notable for the following components:   Glucose, Bld 121 (*)    All other components within normal limits  URINALYSIS, ROUTINE W REFLEX MICROSCOPIC    EKG None  Radiology CT Renal Stone Study  Result Date: 02/27/2021 CLINICAL DATA:  Flank pain.  Kidney stones suspected. EXAM: CT ABDOMEN AND PELVIS WITHOUT CONTRAST TECHNIQUE: Multidetector CT imaging of the abdomen and pelvis was performed following the standard protocol without IV contrast. COMPARISON:  04/08/2016 FINDINGS: Lower chest: No acute abnormality. Hepatobiliary: No focal liver abnormality is seen. No gallstones, gallbladder wall thickening, or biliary  dilatation. Pancreas: Calcifications noted within the head of pancreas, image 32/3. Unchanged from previous exam. No signs of acute inflammation, mass, or main duct dilatation. Spleen: Normal in size without focal abnormality. Adrenals/Urinary Tract: Normal appearance of the adrenal glands. The left kidney is unremarkable. There is right-sided perinephric fat stranding and mild hydronephrosis. Mild right hydroureter is noted. Punctate stone (1-2 mm) is identified at the right UVJ, image 72/3. Urinary bladder otherwise unremarkable. Stomach/Bowel: Small hiatal hernia. Stomach is otherwise unremarkable. The appendix is not confidently identified separate from the right lower quadrant bowel loops. No bowel wall thickening, inflammation, or distension. Vascular/Lymphatic: Aortic atherosclerosis. No enlarged abdominal or pelvic lymph nodes. Reproductive: Prostate gland enlargement. Other:  No free fluid or fluid collections. Fat containing left inguinal hernia. Musculoskeletal: No acute or significant osseous findings. Anterolisthesis of L5 on S1 is identified measuring 4 mm. Bilateral L5 pars defects noted. Multilevel disc space narrowing and endplate spurring is noted throughout the lumbar spine. No acute or suspicious findings. IMPRESSION: 1. Right-sided perinephric fat stranding and mild hydronephrosis secondary to punctate right UVJ calculus. 2. Aortic atherosclerosis. 3. Lumbar spondylosis. Bilateral L5 pars defects with anterolisthesis of L5 on S1. Aortic Atherosclerosis (ICD10-I70.0). Electronically Signed   By: Signa Kell M.D.   On: 02/27/2021 20:16    Procedures Procedures   Medications Ordered in ED Medications - No data to display  ED Course  I have reviewed the triage vital signs and the nursing notes.  Pertinent labs & imaging results that were available during my care of the patient were reviewed by me and considered in my medical decision making (see chart for details).    MDM  Rules/Calculators/A&P                          Pt presenting for evaluation of nausea, vomiting, abdominal pain.  On exam, patient appears nontoxic.  Symptoms have resolved prior to my evaluation.  Labs obtained from triage interpreted by me, overall reassuring.  Mild leukocytosis, likely secondary to pain and kidney stone.  CT shows a kidney stone of the right UVJ.  As symptoms are controlled currently, patient can likely be managed outpatient.  Given follow-up with urology.  Urine without signs of infection, and clinically patient without signs of infection.  Discussed symptomatic management and follow-up with urology/PCP.  At this time, patient appears safe for discharge.  Return precautions given.  Patient states he understands and agrees to plan.   Final Clinical Impression(s) / ED Diagnoses Final diagnoses:  None    Rx / DC Orders ED Discharge Orders    None       Stephania Macfarlane, PA-C 02/27/21 2338    Rozelle Logan, DO 02/28/21 2338

## 2021-02-27 NOTE — ED Triage Notes (Signed)
Pt reports several days ago noted right lower back pain while resting. Today had onset of right lower abd pain around 2pm that was severe and onset of n/v. Pain has eased and moved central, no vomiting at this time. Has hx of kidney stones, denies difficulty urinating today.

## 2021-02-27 NOTE — ED Provider Notes (Signed)
Emergency Medicine Provider Triage Evaluation Note  Jarvin Ogren , a 75 y.o. male  was evaluated in triage.  Pt complains of right flank pain x3 hours.  Nausea and vomiting.  Pain has now resolved.  Prior history of kidney stones, no fevers  Review of Systems  Positive: Right flank pain  Negative: fever  Physical Exam  BP 130/78 (BP Location: Left Arm)   Pulse 79   Temp (!) 97.5 F (36.4 C) (Oral)   Resp 19   SpO2 100%  Gen:   Awake, no distress   Resp:  Normal effort  MSK:   Moves extremities without difficulty  Other:    Medical Decision Making  Medically screening exam initiated at 7:09 PM.  Appropriate orders placed.  Colbin Jovel was informed that the remainder of the evaluation will be completed by another provider, this initial triage assessment does not replace that evaluation, and the importance of remaining in the ED until their evaluation is complete.  Patient here with right flank pain, feels better without improvement. No fever, some urinary symptoms. No chest pain or SOB   Claude Manges, PA-C 02/27/21 1911    Wynetta Fines, MD 02/28/21 1500

## 2021-02-27 NOTE — ED Notes (Signed)
Wife would like to be contacted upon discharge due to pt not having a phone.

## 2021-02-27 NOTE — ED Notes (Signed)
Patient verbalizes understanding of discharge instructions. Prescriptions and follow-up care reviewed. Opportunity for questioning and answers were provided. Armband removed by staff, pt discharged from ED ambulatory.  

## 2021-02-27 NOTE — Discharge Instructions (Addendum)
Continue taking her medications as prescribed. Take Flomax daily to encourage urination until you pass the kidney stone. Zofran as needed for nausea or vomiting. Use Tylenol or ibuprofen as needed for mild to moderate pain.  Use Percocet as needed for severe breakthrough pain.  Have caution, this may make you tired or groggy.  Do not drive or operate heavy machinery while taking this medicine.  This may increase her risk for falls. Follow-up with either the urologist or primary care doctor for recheck of your symptoms if your pain returns at all. Return to emergency room if you develop high fevers, persistent vomiting despite medication, severe worsening abdominal pain, inability to urinate, or any new, worsening, concerning symptoms

## 2021-04-18 DIAGNOSIS — E6609 Other obesity due to excess calories: Secondary | ICD-10-CM | POA: Diagnosis not present

## 2021-04-18 DIAGNOSIS — R7303 Prediabetes: Secondary | ICD-10-CM | POA: Diagnosis not present

## 2021-04-18 DIAGNOSIS — Z87891 Personal history of nicotine dependence: Secondary | ICD-10-CM | POA: Diagnosis not present

## 2021-04-18 DIAGNOSIS — I10 Essential (primary) hypertension: Secondary | ICD-10-CM | POA: Diagnosis not present

## 2021-04-18 DIAGNOSIS — Z6830 Body mass index (BMI) 30.0-30.9, adult: Secondary | ICD-10-CM | POA: Diagnosis not present

## 2021-04-18 DIAGNOSIS — M792 Neuralgia and neuritis, unspecified: Secondary | ICD-10-CM | POA: Diagnosis not present

## 2021-04-18 DIAGNOSIS — E782 Mixed hyperlipidemia: Secondary | ICD-10-CM | POA: Diagnosis not present

## 2021-05-04 DIAGNOSIS — M25512 Pain in left shoulder: Secondary | ICD-10-CM | POA: Diagnosis not present

## 2021-05-04 DIAGNOSIS — M25562 Pain in left knee: Secondary | ICD-10-CM | POA: Diagnosis not present

## 2021-05-04 DIAGNOSIS — M25561 Pain in right knee: Secondary | ICD-10-CM | POA: Diagnosis not present

## 2021-12-08 IMAGING — DX DG CHEST 1V PORT
1 series · 1 of 1 positions shown · non-contrast
Comparison: CT chest 04/08/2016

CLINICAL DATA: Fatigue, weight loss.  Positive for COVID.

EXAM:
PORTABLE CHEST 1 VIEW

[chest ap]
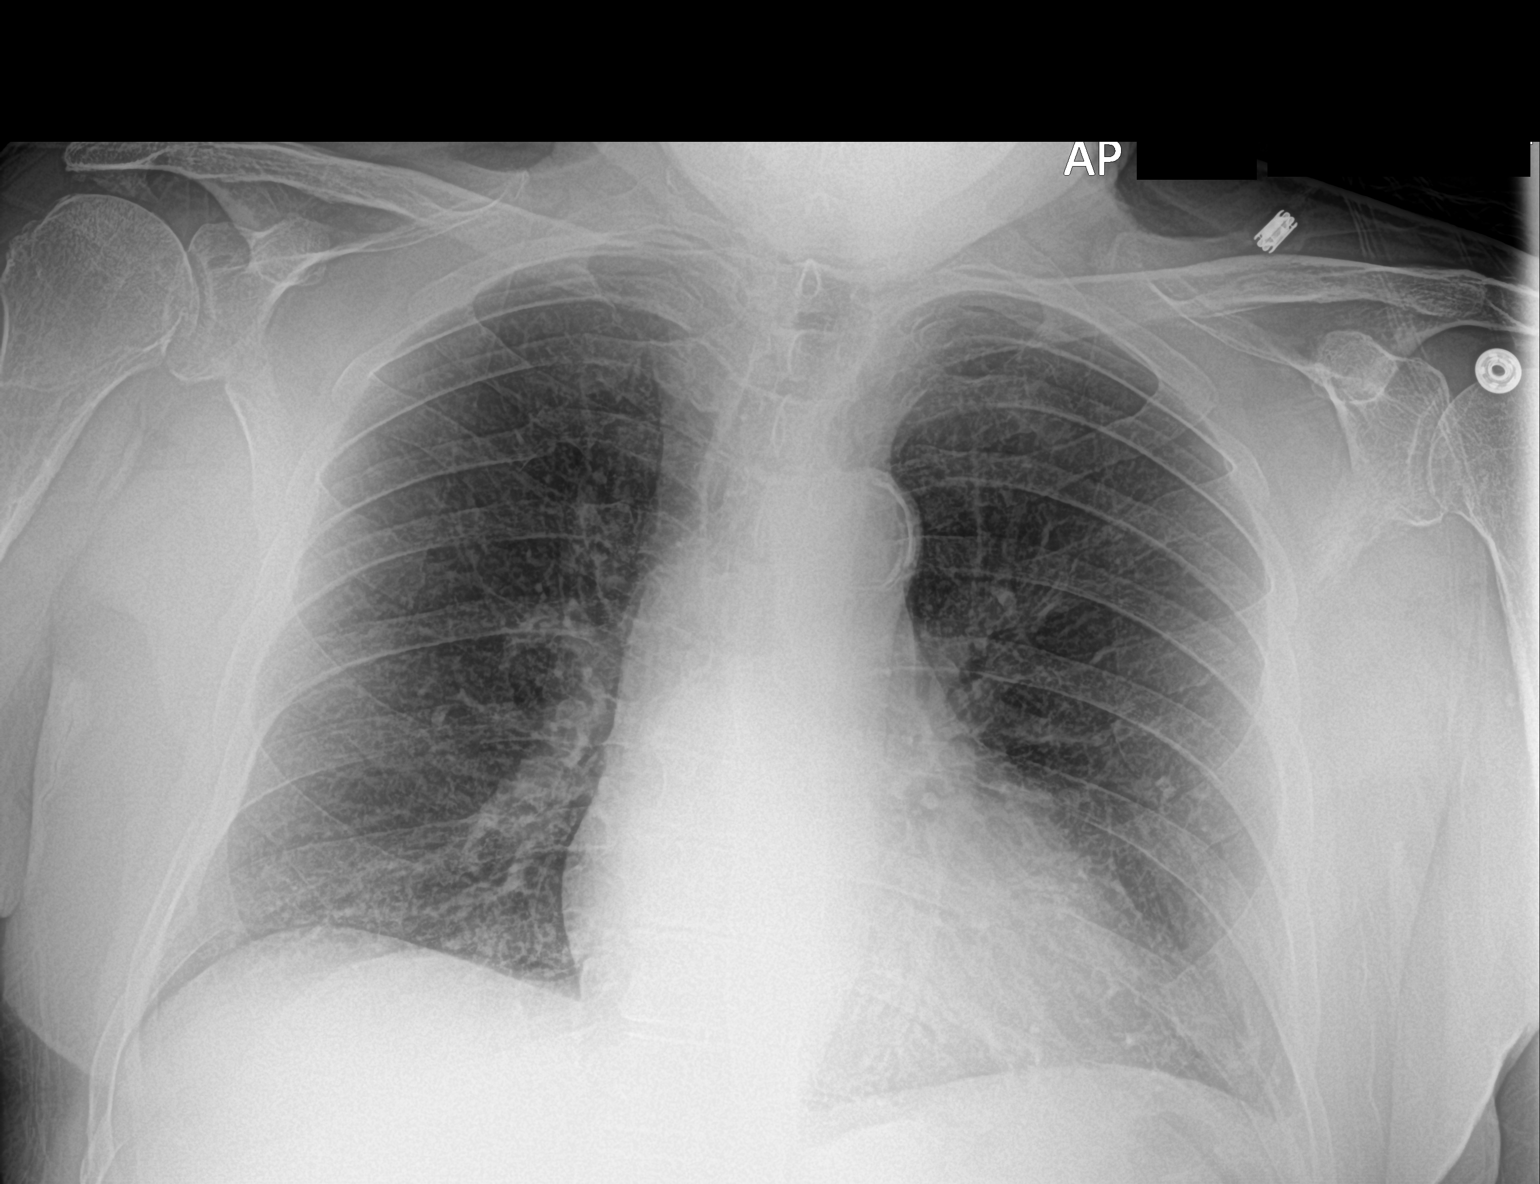

[1 of 1 positions shown; findings below may reference images not displayed]

FINDINGS: The heart size and mediastinal contours are within normal limits.
Aortic atherosclerotic calcification noted. Both lungs are clear.
The visualized skeletal structures are unremarkable.
IMPRESSION: 1. No acute cardiopulmonary abnormalities.
2.  Aortic Atherosclerosis ([id number]).
